# Patient Record
Sex: Female | Born: 1940 | Race: Asian | Hispanic: No | Smoking: Never smoker
Health system: Southern US, Community
[De-identification: ages and names within clinical notes are randomized; demographics above are authoritative.]

## PROBLEM LIST (undated history)

## (undated) DIAGNOSIS — M549 Dorsalgia, unspecified: Secondary | ICD-10-CM

## (undated) DIAGNOSIS — I10 Essential (primary) hypertension: Secondary | ICD-10-CM

## (undated) DIAGNOSIS — E119 Type 2 diabetes mellitus without complications: Secondary | ICD-10-CM

## (undated) HISTORY — PX: NO PAST SURGERIES: SHX2092

---

## 2003-05-23 ENCOUNTER — Emergency Department (HOSPITAL_COMMUNITY): Admission: EM | Admit: 2003-05-23 | Discharge: 2003-05-23 | Payer: Self-pay | Admitting: Emergency Medicine

## 2008-03-21 ENCOUNTER — Emergency Department (HOSPITAL_COMMUNITY): Admission: EM | Admit: 2008-03-21 | Discharge: 2008-03-21 | Payer: Self-pay | Admitting: Emergency Medicine

## 2013-08-13 ENCOUNTER — Encounter (HOSPITAL_COMMUNITY): Payer: Self-pay | Admitting: Cardiology

## 2013-08-13 ENCOUNTER — Emergency Department (HOSPITAL_COMMUNITY)
Admission: EM | Admit: 2013-08-13 | Discharge: 2013-08-13 | Disposition: A | Discharge status: Home / Self Care | Payer: Medicare Other | Attending: Emergency Medicine | Admitting: Emergency Medicine

## 2013-08-13 ENCOUNTER — Emergency Department (HOSPITAL_COMMUNITY): Payer: Medicare Other

## 2013-08-13 DIAGNOSIS — R0600 Dyspnea, unspecified: Secondary | ICD-10-CM

## 2013-08-13 DIAGNOSIS — M549 Dorsalgia, unspecified: Secondary | ICD-10-CM | POA: Insufficient documentation

## 2013-08-13 DIAGNOSIS — R5383 Other fatigue: Secondary | ICD-10-CM

## 2013-08-13 DIAGNOSIS — Z791 Long term (current) use of non-steroidal anti-inflammatories (NSAID): Secondary | ICD-10-CM | POA: Insufficient documentation

## 2013-08-13 DIAGNOSIS — R0609 Other forms of dyspnea: Secondary | ICD-10-CM | POA: Insufficient documentation

## 2013-08-13 DIAGNOSIS — R5381 Other malaise: Secondary | ICD-10-CM | POA: Insufficient documentation

## 2013-08-13 DIAGNOSIS — I10 Essential (primary) hypertension: Secondary | ICD-10-CM | POA: Insufficient documentation

## 2013-08-13 DIAGNOSIS — Z79899 Other long term (current) drug therapy: Secondary | ICD-10-CM | POA: Insufficient documentation

## 2013-08-13 DIAGNOSIS — R0989 Other specified symptoms and signs involving the circulatory and respiratory systems: Secondary | ICD-10-CM | POA: Insufficient documentation

## 2013-08-13 HISTORY — DX: Essential (primary) hypertension: I10

## 2013-08-13 HISTORY — DX: Dorsalgia, unspecified: M54.9

## 2013-08-13 LAB — URINALYSIS, ROUTINE W REFLEX MICROSCOPIC
Glucose, UA: NEGATIVE mg/dL
Leukocytes, UA: NEGATIVE
Specific Gravity, Urine: 1.009 (ref 1.005–1.030)
pH: 6 (ref 5.0–8.0)

## 2013-08-13 LAB — CBC
MCH: 29.7 pg (ref 26.0–34.0)
MCV: 88 fL (ref 78.0–100.0)
Platelets: 223 10*3/uL (ref 150–400)
RBC: 4.74 MIL/uL (ref 3.87–5.11)

## 2013-08-13 LAB — BASIC METABOLIC PANEL
CO2: 27 mEq/L (ref 19–32)
Calcium: 9.7 mg/dL (ref 8.4–10.5)
Creatinine, Ser: 0.9 mg/dL (ref 0.50–1.10)
Glucose, Bld: 115 mg/dL — ABNORMAL HIGH (ref 70–99)

## 2013-08-13 LAB — PRO B NATRIURETIC PEPTIDE: Pro B Natriuretic peptide (BNP): 7.1 pg/mL (ref 0–125)

## 2013-08-13 NOTE — ED Provider Notes (Signed)
CSN: [id number]     Arrival date & time 08/13/13  1622 History   First MD Initiated Contact with Patient 08/13/13 1943     Chief Complaint  Patient presents with  . Shortness of Breath   (Consider location/radiation/quality/duration/timing/severity/associated sxs/prior Treatment) HPI Comments: 72 yo female with htn, borderline dm presents with gradually worsening sob the past three days.  Pt feels both mild sob and general fatigue.  Patient denies blood clot history, active cancer, recent major trauma or surgery, unilateral leg swelling/ pain.  No CP. Non smoker.    Patient is a 72 y.o. female presenting with shortness of breath. The history is provided by the patient and a relative. The history is limited by a language barrier.  Shortness of Breath Severity:  Moderate Associated symptoms: no abdominal pain, no chest pain, no cough, no fever, no headaches, no rash and no vomiting     Past Medical History  Diagnosis Date  . Hypertension   . Back pain    History reviewed. No pertinent past surgical history. History reviewed. No pertinent family history. History  Substance Use Topics  . Smoking status: Never Smoker   . Smokeless tobacco: Not on file  . Alcohol Use: No   OB History   Grav Para Term Preterm Abortions TAB SAB Ect Mult Living                 Review of Systems  Constitutional: Positive for fatigue. Negative for fever and chills.  Respiratory: Positive for shortness of breath. Negative for cough.   Cardiovascular: Negative for chest pain and leg swelling.  Gastrointestinal: Negative for vomiting and abdominal pain.  Genitourinary: Negative for dysuria.  Musculoskeletal: Negative for back pain.  Skin: Negative for rash.  Neurological: Negative for light-headedness and headaches.    Allergies  Review of patient's allergies indicates no known allergies.  Home Medications   Current Outpatient Rx  Name  Route  Sig  Dispense  Refill  . hydrochlorothiazide  (HYDRODIURIL) 25 MG tablet   Oral   Take 25 mg by mouth daily.         Marland Kitchen HYDROcodone-acetaminophen (NORCO/VICODIN) 5-325 MG per tablet   Oral   Take 1 tablet by mouth every 6 (six) hours as needed for pain.         Marland Kitchen ipratropium (ATROVENT HFA) 17 MCG/ACT inhaler   Inhalation   Inhale 2 puffs into the lungs 4 (four) times daily.         Marland Kitchen lisinopril (PRINIVIL,ZESTRIL) 5 MG tablet   Oral   Take 5 mg by mouth daily.         . meloxicam (MOBIC) 15 MG tablet   Oral   Take 15 mg by mouth daily.          BP 138/69  Pulse 74  Temp(Src) 98.5 F (36.9 C) (Oral)  Resp 20  SpO2 99% Physical Exam  Nursing note and vitals reviewed. Constitutional: She is oriented to person, place, and time. She appears well-developed and well-nourished.  HENT:  Head: Normocephalic and atraumatic.  Eyes: Conjunctivae are normal. Right eye exhibits no discharge. Left eye exhibits no discharge.  Neck: Normal range of motion. Neck supple. No tracheal deviation present.  Cardiovascular: Normal rate and regular rhythm.   Pulmonary/Chest: Effort normal and breath sounds normal.  Abdominal: Soft. She exhibits no distension. There is no tenderness. There is no guarding.  Musculoskeletal: She exhibits no edema and no tenderness.  Neurological: She is alert and oriented to  person, place, and time. No cranial nerve deficit.  Skin: Skin is warm. No rash noted.  Psychiatric: She has a normal mood and affect.    ED Course  Procedures (including critical care time) Labs Review Labs Reviewed  CBC - Abnormal; Notable for the following:    WBC 10.8 (*)    All other components within normal limits  BASIC METABOLIC PANEL - Abnormal; Notable for the following:    Sodium 133 (*)    Glucose, Bld 115 (*)    BUN 27 (*)    GFR calc non Af Amer 63 (*)    GFR calc Af Amer 73 (*)    All other components within normal limits  PRO B NATRIURETIC PEPTIDE  URINALYSIS, ROUTINE W REFLEX MICROSCOPIC  TROPONIN I   POCT I-STAT TROPONIN I   Imaging Review Dg Chest 2 View  08/13/2013   *RADIOLOGY REPORT*  Clinical Data: Shortness of breath  CHEST - 2 VIEW  Comparison: None.  Findings: There is no edema or consolidation.  Heart size and pulmonary vascularity are normal.  No adenopathy.  There is atherosclerotic change in the aorta.  There is mild upper thoracic dextroscoliosis.  IMPRESSION: No edema or consolidation.   Original Report Authenticated By: Bretta Bang, M.D.    MDM  No diagnosis found. Family translated, comfortable with plan.  Vague sxs.  Pt denies depression sxs.  Cardiac/ lab workup no acute findings. Close fup discussed.   CXR and ekg reviewed.  Date: 08/13/2013  Rate: 78  Rhythm: normal sinus rhythm  QRS Axis: normal  Intervals: normal  ST/T Wave abnormalities: normal  Conduction Disutrbances:none  Narrative Interpretation:   Old EKG Reviewed: none available  DC after delta troponin neg.     Enid Skeens, MD 08/13/13 2130

## 2013-08-13 NOTE — ED Notes (Signed)
Pt and family reports that increased SOB and fatigue over the past couple of days. States that she also has a hx of lower back pain. Denies any chest pain at this time. Skin warm and dry.

## 2013-09-23 ENCOUNTER — Other Ambulatory Visit: Payer: Self-pay | Admitting: Orthopedic Surgery

## 2013-09-30 ENCOUNTER — Other Ambulatory Visit: Payer: Self-pay | Admitting: Orthopedic Surgery

## 2013-09-30 NOTE — H&P (Signed)
Shannon Stephenson is an 72 y.o. female.   Chief Complaint: back and right leg pain HPI: The patient is a 72 year old female who presents with back pain. The patient reports low back symptoms including low back pain and right lower extremity pain which began 2 month(s) ago in association with an established activity. The injury occurred due to lifting (her grandchild). The pain radiates to the right posterior thigh, right posterior lower leg and right foot. The patient describes the severity of their symptoms as severe. Symptoms are exacerbated by standing, lifting and bending, while symptoms are not exacerbated by recumbency. Prior to being seen today the patient was previously evaluated by Dr. Ramos and Dr. Matthews. Past evaluation has included x-ray of the lumbar spine and MRI of the lumbar spine. Past treatment has included non-opioid analgesics (Tramadol and Tylenol) and physical therapy. Severe pain down the right leg and also on the left. She is here on kind referral from Dr. Ramos. They talked about epidurals. She has declined. She is here with her daughter. Past Medical History  Diagnosis Date  . Hypertension   . Back pain     No past surgical history on file.  No family history on file. Social History:  reports that she has never smoked. She does not have any smokeless tobacco history on file. She reports that she does not drink alcohol or use illicit drugs.  Allergies: No Known Allergies   (Not in a hospital admission)  No results found for this or any previous visit (from the past 48 hour(s)). No results found.  Review of Systems  Constitutional: Negative.   HENT: Negative.   Eyes: Negative.   Respiratory: Negative.   Cardiovascular: Negative.   Gastrointestinal: Negative.   Genitourinary: Negative.   Musculoskeletal: Positive for back pain.  Skin: Negative.   Neurological: Positive for focal weakness.  Endo/Heme/Allergies: Negative.   Psychiatric/Behavioral:  Negative.     There were no vitals taken for this visit. Physical Exam  Constitutional: She is oriented to person, place, and time. She appears well-developed and well-nourished.  HENT:  Head: Normocephalic and atraumatic.  Eyes: Conjunctivae and EOM are normal. Pupils are equal, round, and reactive to light.  Neck: Normal range of motion. Neck supple.  Cardiovascular: Normal rate.   Respiratory: Effort normal and breath sounds normal.  GI: Soft. Bowel sounds are normal.  Musculoskeletal:  On exam, in moderate distress. Walks with antalgic gait and forward flexion. Mood and affect is appropriate. Straight leg raise on the right produces buttock, thigh, and calf pain with buttock pain on the left. She has quad weakness and EHL weakness on the right compared to the left. Pain with extension and relieved with flexion. Pulses are intact. No DVT. Inspection of the cervical spine reveals a normal lordosis without evidence of paraspinous spasms or soft tissue swelling. Nontender to palpation. Full flexion, full extension, full left and right lateral rotation. Extension combined with lateral flexion does not reproduce pain. Negative impingement sign, negative secondary impingement sign of the shoulders. Negative Tinel's at median and ulnar nerves at the elbow. Negative carpal compression test at the wrists. Motor of the upper extremities is 5/5 including biceps, triceps, brachioradialis, wrist flexion, wrist extension, finger flexion, finger extension. Reflexes are normoreflexia. Sensory exam is intact to light touch. There is no Hoffmann sign. Nontender over the thoracic spine.  She is hyporeflexic. Good pulses. Compartments are soft.  Neurological: She is alert and oriented to person, place, and time. She has   normal reflexes.  Skin: Skin is warm and dry.  Psychiatric: She has a normal mood and affect.    Three view x-rays show multilevel disc degeneration. Neuroforaminal  narrowing.  MRI demonstrates moderately severe stenosis at 3-4 and at 4-5. Severe in the lateral recess at 4-5 centrally at 3-4 and some foraminal stenosis.  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 Refractory to rest, activity modification. She has done home stretches and exercises. Neural tension signs. Quad weakness. EHL weakness. Severe pain. Multilevel disc degeneration.  I had an extensive discussion of the risks and benefits of the lumbar decompression with the patient including bleeding, infection, damage to neurovascular structures, epidural fibrosis, CSF leak requiring repair. We also discussed increase in pain, adjacent segment disease, recurrent disc herniation, need for future surgery including repeat decompression and/or fusion. We also discussed risks of postoperative hematoma, paralysis, anesthetic complications including DVT, PE, death, cardiopulmonary dysfunction. In addition, the perioperative and postoperative courses were discussed in detail including the rehabilitative time and return to functional activity and work. I provided the patient with an illustrated handout and utilized the appropriate surgical models.  I had an extensive discussion concerning current pathology, relevant anatomy, and treatment options. She is reporting being disabled by her symptoms. She has to sit. She cannot stand. She uses an assistive device. They do not want to proceed with epidurals and given the recent studies shows no changes in the long term outcome. I think that is reasonable. We will have her undergo preop clearance and discussed decompression at 4-5 and at 3-4. Overnight. She does not speak English. They are from [place]. Her daughter will be present. No history of DVT. She is significantly symptomatic and she reports multiyear history of this and she can not even walk to the mail box at this point in time. I feel  her main problem could be at 4-5. She is having knee pain as well. I will have her undergo preop clearance. I appreciate the kind referral by Dr. Ramos.  Plan microlumbar decompression L3-4, L4-5  Rani Idler M. for Dr. Beane 09/30/2013, 12:01 PM    

## 2013-10-03 NOTE — Patient Instructions (Addendum)
20      Your procedure is scheduled on:  Wednesday 10/09/2013  Report to [facility] at  05:30 AM.  Call this number if you have problems the night before or morning of surgery: [phone]   Remember:  Do not eat food or drink liquids AFTER MIDNIGHT!   Do not bring valuables to the hospital. [facility] IS NOT RESPONSIBLE  FOR ANY BELONGINGS OR VALUABLES BROUGHT TO HOSPITAL.  Leave suitcase in the car. After surgery it may be brought to your room.  For patients admitted to the hospital, checkout time is 11:00 AM the day of              Discharge.    DO NOT WEAR JEWELRY , MAKE-UP, LOTIONS,POWDERS,PERFUMES!             WOMEN -DO NOT SHAVE LEGS OR UNDERARMS 12 HRS. BEFORE SURGERY!               MEN MAY SHAVE AS USUAL!             CONTACTS,DENTURES OR BRIDGEWORK, FALSE EYELASHES MAY NOT BE WORN INTO SURGERY!                                        Special Instructions:             Please read over the following fact sheets that you were given: INCENTIVE SPIROMETRY                                        Hope Pigeon     [phone]                FAILURE TO FOLLOW THESE INSTRUCTIONS MAY RESULT IN  CANCELLATION OF YOUR SURGERY!               Patient Signature:___________________________

## 2013-10-04 ENCOUNTER — Encounter (HOSPITAL_COMMUNITY): Payer: Self-pay | Admitting: Pharmacy Technician

## 2013-10-04 ENCOUNTER — Encounter (HOSPITAL_COMMUNITY): Payer: Self-pay

## 2013-10-04 ENCOUNTER — Ambulatory Visit (HOSPITAL_COMMUNITY)
Admission: RE | Admit: 2013-10-04 | Discharge: 2013-10-04 | Disposition: A | Discharge status: Home / Self Care | Payer: Medicare Other | Source: Ambulatory Visit | Attending: Orthopedic Surgery | Admitting: Orthopedic Surgery

## 2013-10-04 ENCOUNTER — Encounter (HOSPITAL_COMMUNITY)
Admission: RE | Admit: 2013-10-04 | Discharge: 2013-10-04 | Disposition: A | Discharge status: Home / Self Care | Payer: Medicare Other | Source: Ambulatory Visit | Attending: Specialist | Admitting: Specialist

## 2013-10-04 DIAGNOSIS — Z01812 Encounter for preprocedural laboratory examination: Secondary | ICD-10-CM | POA: Insufficient documentation

## 2013-10-04 DIAGNOSIS — M5137 Other intervertebral disc degeneration, lumbosacral region: Secondary | ICD-10-CM | POA: Insufficient documentation

## 2013-10-04 DIAGNOSIS — Z01818 Encounter for other preprocedural examination: Secondary | ICD-10-CM | POA: Insufficient documentation

## 2013-10-04 DIAGNOSIS — M51379 Other intervertebral disc degeneration, lumbosacral region without mention of lumbar back pain or lower extremity pain: Secondary | ICD-10-CM | POA: Insufficient documentation

## 2013-10-04 LAB — SURGICAL PCR SCREEN
MRSA, PCR: NEGATIVE
Staphylococcus aureus: NEGATIVE

## 2013-10-04 LAB — CBC
Hemoglobin: 13.5 g/dL (ref 12.0–15.0)
MCH: 28.9 pg (ref 26.0–34.0)
MCHC: 32.4 g/dL (ref 30.0–36.0)
Platelets: 279 10*3/uL (ref 150–400)
RDW: 12.9 % (ref 11.5–15.5)
WBC: 10.8 10*3/uL — ABNORMAL HIGH (ref 4.0–10.5)

## 2013-10-04 LAB — BASIC METABOLIC PANEL
BUN: 31 mg/dL — ABNORMAL HIGH (ref 6–23)
Calcium: 10.1 mg/dL (ref 8.4–10.5)
Chloride: 97 mEq/L (ref 96–112)
Creatinine, Ser: 0.9 mg/dL (ref 0.50–1.10)
GFR calc Af Amer: 73 mL/min — ABNORMAL LOW (ref >90)
GFR calc non Af Amer: 63 mL/min — ABNORMAL LOW (ref >90)

## 2013-10-04 NOTE — Progress Notes (Signed)
Chest x-ray 08/13/13 on EPIC, EKG 08/13/13 on EPIC

## 2013-10-08 NOTE — Anesthesia Preprocedure Evaluation (Addendum)
Anesthesia Evaluation  Patient identified by MRN, date of birth, ID band Patient awake    Reviewed: Allergy & Precautions, H&P , NPO status , Patient's Chart, lab work & pertinent test results  Airway Mallampati: II TM Distance: >3 FB Neck ROM: full    Dental no notable dental hx. (+) Teeth Intact and Dental Advisory Given   Pulmonary neg pulmonary ROS,  breath sounds clear to auscultation  Pulmonary exam normal       Cardiovascular hypertension, Pt. on medications Rhythm:regular Rate:Normal     Neuro/Psych negative neurological ROS  negative psych ROS   GI/Hepatic negative GI ROS, Neg liver ROS,   Endo/Other  negative endocrine ROS  Renal/GU negative Renal ROS  negative genitourinary   Musculoskeletal   Abdominal   Peds  Hematology negative hematology ROS (+)   Anesthesia Other Findings   Reproductive/Obstetrics negative OB ROS                          Anesthesia Physical Anesthesia Plan  ASA: II  Anesthesia Plan: General   Post-op Pain Management:    Induction: Intravenous  Airway Management Planned: Oral ETT  Additional Equipment:   Intra-op Plan:   Post-operative Plan: Extubation in OR  Informed Consent: I have reviewed the patients History and Physical, chart, labs and discussed the procedure including the risks, benefits and alternatives for the proposed anesthesia with the patient or authorized representative who has indicated his/her understanding and acceptance.   Dental Advisory Given  Plan Discussed with: CRNA and Surgeon  Anesthesia Plan Comments:         Anesthesia Quick Evaluation

## 2013-10-09 ENCOUNTER — Encounter (HOSPITAL_COMMUNITY): Payer: Medicare Other | Admitting: Anesthesiology

## 2013-10-09 ENCOUNTER — Encounter (HOSPITAL_COMMUNITY)
Admission: RE | Disposition: A | Discharge status: Home / Self Care | Payer: Self-pay | Source: Ambulatory Visit | Attending: Specialist

## 2013-10-09 ENCOUNTER — Ambulatory Visit (HOSPITAL_COMMUNITY): Payer: Medicare Other

## 2013-10-09 ENCOUNTER — Ambulatory Visit (HOSPITAL_COMMUNITY): Payer: Medicare Other | Admitting: Anesthesiology

## 2013-10-09 ENCOUNTER — Observation Stay (HOSPITAL_COMMUNITY)
Admission: RE | Admit: 2013-10-09 | Discharge: 2013-10-10 | Disposition: A | Discharge status: Home / Self Care | Payer: Medicare Other | Source: Ambulatory Visit | Attending: Specialist | Admitting: Specialist

## 2013-10-09 ENCOUNTER — Encounter (HOSPITAL_COMMUNITY): Payer: Self-pay | Admitting: *Deleted

## 2013-10-09 DIAGNOSIS — M5137 Other intervertebral disc degeneration, lumbosacral region: Secondary | ICD-10-CM | POA: Insufficient documentation

## 2013-10-09 DIAGNOSIS — I10 Essential (primary) hypertension: Secondary | ICD-10-CM | POA: Insufficient documentation

## 2013-10-09 DIAGNOSIS — M48061 Spinal stenosis, lumbar region without neurogenic claudication: Principal | ICD-10-CM

## 2013-10-09 DIAGNOSIS — R29898 Other symptoms and signs involving the musculoskeletal system: Secondary | ICD-10-CM | POA: Insufficient documentation

## 2013-10-09 DIAGNOSIS — M51379 Other intervertebral disc degeneration, lumbosacral region without mention of lumbar back pain or lower extremity pain: Secondary | ICD-10-CM | POA: Insufficient documentation

## 2013-10-09 DIAGNOSIS — M79609 Pain in unspecified limb: Secondary | ICD-10-CM | POA: Insufficient documentation

## 2013-10-09 HISTORY — PX: LUMBAR LAMINECTOMY/DECOMPRESSION MICRODISCECTOMY: SHX5026

## 2013-10-09 LAB — PROTIME-INR: INR: 1.03 (ref 0.00–1.49)

## 2013-10-09 SURGERY — LUMBAR LAMINECTOMY/DECOMPRESSION MICRODISCECTOMY 2 LEVELS
Anesthesia: General | Site: Back | Laterality: Bilateral | Wound class: Clean

## 2013-10-09 MED ORDER — LACTATED RINGERS IV SOLN
INTRAVENOUS | Status: DC
  Administered 2013-10-09 (×3): via INTRAVENOUS

## 2013-10-09 MED ORDER — ONDANSETRON HCL 4 MG/2ML IJ SOLN
INTRAMUSCULAR | Status: DC | PRN
  Administered 2013-10-09: 4 mg via INTRAVENOUS

## 2013-10-09 MED ORDER — KCL IN DEXTROSE-NACL 20-5-0.9 MEQ/L-%-% IV SOLN
INTRAVENOUS | Status: AC
  Administered 2013-10-09: 50 mL/h via INTRAVENOUS
  Filled 2013-10-09 (×2): qty 1000

## 2013-10-09 MED ORDER — DOCUSATE SODIUM 100 MG PO CAPS
100.0000 mg | ORAL_CAPSULE | Freq: Two times a day (BID) | ORAL | Status: DC
  Administered 2013-10-09 – 2013-10-10 (×2): 100 mg via ORAL

## 2013-10-09 MED ORDER — LISINOPRIL 5 MG PO TABS: 5.0000 mg | ORAL_TABLET | Freq: Every morning | ORAL | Status: DC

## 2013-10-09 MED ORDER — BUPIVACAINE-EPINEPHRINE (PF) 0.5% -1:200000 IJ SOLN
INTRAMUSCULAR | Status: DC | PRN
  Administered 2013-10-09: 17 mL

## 2013-10-09 MED ORDER — PHENYLEPHRINE HCL 10 MG/ML IJ SOLN
INTRAMUSCULAR | Status: DC | PRN
  Administered 2013-10-09 (×3): 80 ug via INTRAVENOUS
  Administered 2013-10-09 (×2): 120 ug via INTRAVENOUS

## 2013-10-09 MED ORDER — GLYCOPYRROLATE 0.2 MG/ML IJ SOLN
INTRAMUSCULAR | Status: DC | PRN
  Administered 2013-10-09: .5 mg via INTRAVENOUS

## 2013-10-09 MED ORDER — NEOSTIGMINE METHYLSULFATE 1 MG/ML IJ SOLN
INTRAMUSCULAR | Status: DC | PRN
  Administered 2013-10-09: 4 mg via INTRAVENOUS

## 2013-10-09 MED ORDER — THROMBIN 5000 UNITS EX SOLR
CUTANEOUS | Status: DC | PRN
  Administered 2013-10-09: 10000 [IU] via TOPICAL

## 2013-10-09 MED ORDER — MENTHOL 3 MG MT LOZG: 1.0000 | LOZENGE | OROMUCOSAL | Status: DC | PRN

## 2013-10-09 MED ORDER — ONDANSETRON HCL 4 MG/2ML IJ SOLN: 4.0000 mg | INTRAMUSCULAR | Status: DC | PRN

## 2013-10-09 MED ORDER — PHENOL 1.4 % MT LIQD: 1.0000 | OROMUCOSAL | Status: DC | PRN

## 2013-10-09 MED ORDER — OXYCODONE-ACETAMINOPHEN 5-325 MG PO TABS
1.0000 | ORAL_TABLET | ORAL | Status: DC | PRN
Start: 2013-10-09 — End: 2013-10-10
  Administered 2013-10-09: 1 via ORAL
  Administered 2013-10-10: 2 via ORAL
  Filled 2013-10-09: qty 2
  Filled 2013-10-09: qty 1

## 2013-10-09 MED ORDER — ACETAMINOPHEN 650 MG RE SUPP: 650.0000 mg | RECTAL | Status: DC | PRN

## 2013-10-09 MED ORDER — ROCURONIUM BROMIDE 100 MG/10ML IV SOLN
INTRAVENOUS | Status: DC | PRN
  Administered 2013-10-09: 20 mg via INTRAVENOUS

## 2013-10-09 MED ORDER — HYDROCHLOROTHIAZIDE 25 MG PO TABS
25.0000 mg | ORAL_TABLET | Freq: Every morning | ORAL | Status: DC
  Administered 2013-10-10: 08:00:00 25 mg via ORAL
  Filled 2013-10-09 (×2): qty 1

## 2013-10-09 MED ORDER — LACTATED RINGERS IV SOLN: INTRAVENOUS | Status: DC

## 2013-10-09 MED ORDER — CEFAZOLIN SODIUM-DEXTROSE 2-3 GM-% IV SOLR
INTRAVENOUS | Status: AC
  Filled 2013-10-09: qty 50

## 2013-10-09 MED ORDER — DEXAMETHASONE SODIUM PHOSPHATE 10 MG/ML IJ SOLN
INTRAMUSCULAR | Status: DC | PRN
  Administered 2013-10-09: 10 mg via INTRAVENOUS

## 2013-10-09 MED ORDER — SUCCINYLCHOLINE CHLORIDE 20 MG/ML IJ SOLN
INTRAMUSCULAR | Status: DC | PRN
  Administered 2013-10-09: 100 mg via INTRAVENOUS

## 2013-10-09 MED ORDER — HYDROMORPHONE HCL PF 1 MG/ML IJ SOLN
INTRAMUSCULAR | Status: AC
  Filled 2013-10-09: qty 1

## 2013-10-09 MED ORDER — HYDROMORPHONE HCL PF 1 MG/ML IJ SOLN
0.5000 mg | INTRAMUSCULAR | Status: DC | PRN
Start: 2013-10-09 — End: 2013-10-10
  Administered 2013-10-09: 13:00:00 0.5 mg via INTRAVENOUS

## 2013-10-09 MED ORDER — CEFAZOLIN SODIUM-DEXTROSE 2-3 GM-% IV SOLR
2.0000 g | Freq: Three times a day (TID) | INTRAVENOUS | Status: AC
  Administered 2013-10-09 (×2): 2 g via INTRAVENOUS
  Filled 2013-10-09 (×3): qty 50

## 2013-10-09 MED ORDER — THROMBIN 5000 UNITS EX SOLR
CUTANEOUS | Status: AC
  Filled 2013-10-09: qty 10000

## 2013-10-09 MED ORDER — BUPIVACAINE-EPINEPHRINE PF 0.5-1:200000 % IJ SOLN
INTRAMUSCULAR | Status: AC
  Filled 2013-10-09: qty 30

## 2013-10-09 MED ORDER — FENTANYL CITRATE 0.05 MG/ML IJ SOLN
INTRAMUSCULAR | Status: DC | PRN
  Administered 2013-10-09: 50 ug via INTRAVENOUS
  Administered 2013-10-09: 100 ug via INTRAVENOUS

## 2013-10-09 MED ORDER — SODIUM CHLORIDE 0.9 % IV SOLN: 250.0000 mL | INTRAVENOUS | Status: DC

## 2013-10-09 MED ORDER — TRAMADOL HCL 50 MG PO TABS: 50.0000 mg | ORAL_TABLET | Freq: Three times a day (TID) | ORAL | Status: DC | PRN

## 2013-10-09 MED ORDER — SODIUM CHLORIDE 0.9 % IJ SOLN: 3.0000 mL | Freq: Two times a day (BID) | INTRAMUSCULAR | Status: DC

## 2013-10-09 MED ORDER — CEFAZOLIN SODIUM-DEXTROSE 2-3 GM-% IV SOLR
2.0000 g | INTRAVENOUS | Status: AC
  Administered 2013-10-09: 2 g via INTRAVENOUS

## 2013-10-09 MED ORDER — HYDROMORPHONE HCL PF 1 MG/ML IJ SOLN: 0.2500 mg | INTRAMUSCULAR | Status: DC | PRN

## 2013-10-09 MED ORDER — PROPOFOL 10 MG/ML IV BOLUS
INTRAVENOUS | Status: DC | PRN
  Administered 2013-10-09: 150 mg via INTRAVENOUS

## 2013-10-09 MED ORDER — SODIUM CHLORIDE 0.9 % IR SOLN
Status: DC | PRN
  Administered 2013-10-09: 09:00:00

## 2013-10-09 MED ORDER — ACETAMINOPHEN 325 MG PO TABS: 650.0000 mg | ORAL_TABLET | ORAL | Status: DC | PRN

## 2013-10-09 MED ORDER — TRAMADOL HCL 50 MG PO TABS: 50.0000 mg | ORAL_TABLET | Freq: Four times a day (QID) | ORAL | Status: AC | PRN

## 2013-10-09 MED ORDER — SODIUM CHLORIDE 0.9 % IJ SOLN: 3.0000 mL | INTRAMUSCULAR | Status: DC | PRN

## 2013-10-09 MED ORDER — LISINOPRIL 5 MG PO TABS
5.0000 mg | ORAL_TABLET | Freq: Every morning | ORAL | Status: DC
  Administered 2013-10-10: 5 mg via ORAL
  Filled 2013-10-09 (×2): qty 1

## 2013-10-09 MED ORDER — HYDROCODONE-ACETAMINOPHEN 5-325 MG PO TABS: 1.0000 | ORAL_TABLET | ORAL | Status: DC | PRN

## 2013-10-09 MED ORDER — LIP MEDEX EX OINT
TOPICAL_OINTMENT | CUTANEOUS | Status: AC
  Filled 2013-10-09: qty 7

## 2013-10-09 SURGICAL SUPPLY — 48 items
BAG ZIPLOCK 12X15 (MISCELLANEOUS) ×2 IMPLANT
BENZOIN TINCTURE PRP APPL 2/3 (GAUZE/BANDAGES/DRESSINGS) ×2 IMPLANT
CHLORAPREP W/TINT 26ML (MISCELLANEOUS) IMPLANT
CLEANER TIP ELECTROSURG 2X2 (MISCELLANEOUS) ×2 IMPLANT
CLOTH 2% CHLOROHEXIDINE 3PK (PERSONAL CARE ITEMS) ×2 IMPLANT
DECANTER SPIKE VIAL GLASS SM (MISCELLANEOUS) ×2 IMPLANT
DRAPE MICROSCOPE LEICA (MISCELLANEOUS) ×2 IMPLANT
DRAPE POUCH INSTRU U-SHP 10X18 (DRAPES) ×2 IMPLANT
DRAPE SURG 17X11 SM STRL (DRAPES) ×2 IMPLANT
DRAPE UTILITY XL STRL (DRAPES) ×2 IMPLANT
DRSG AQUACEL AG ADV 3.5X 4 (GAUZE/BANDAGES/DRESSINGS) IMPLANT
DRSG AQUACEL AG ADV 3.5X 6 (GAUZE/BANDAGES/DRESSINGS) IMPLANT
DURAPREP 26ML APPLICATOR (WOUND CARE) ×2 IMPLANT
DURASEAL SPINE SEALANT 3ML (MISCELLANEOUS) IMPLANT
ELECT BLADE TIP CTD 4 INCH (ELECTRODE) ×2 IMPLANT
ELECT REM PT RETURN 9FT ADLT (ELECTROSURGICAL) ×2
ELECTRODE REM PT RTRN 9FT ADLT (ELECTROSURGICAL) ×1 IMPLANT
FLOSEAL 10ML (HEMOSTASIS) ×2 IMPLANT
GLOVE BIOGEL PI IND STRL 7.5 (GLOVE) ×1 IMPLANT
GLOVE BIOGEL PI INDICATOR 7.5 (GLOVE) ×1
GLOVE SURG SS PI 7.5 STRL IVOR (GLOVE) ×2 IMPLANT
GLOVE SURG SS PI 8.0 STRL IVOR (GLOVE) ×4 IMPLANT
GOWN STRL REIN XL XLG (GOWN DISPOSABLE) ×4 IMPLANT
IV CATH 14GX2 1/4 (CATHETERS) ×2 IMPLANT
KIT BASIN OR (CUSTOM PROCEDURE TRAY) ×2 IMPLANT
KIT POSITIONING SURG ANDREWS (MISCELLANEOUS) ×2 IMPLANT
MANIFOLD NEPTUNE II (INSTRUMENTS) ×2 IMPLANT
NEEDLE SPNL 18GX3.5 QUINCKE PK (NEEDLE) ×6 IMPLANT
PATTIES SURGICAL .5 X.5 (GAUZE/BANDAGES/DRESSINGS) IMPLANT
PATTIES SURGICAL .75X.75 (GAUZE/BANDAGES/DRESSINGS) IMPLANT
PATTIES SURGICAL 1X1 (DISPOSABLE) IMPLANT
SPONGE GAUZE 4X4 12PLY (GAUZE/BANDAGES/DRESSINGS) ×2 IMPLANT
SPONGE SURGIFOAM ABS GEL 100 (HEMOSTASIS) ×2 IMPLANT
STRIP CLOSURE SKIN 1/2X4 (GAUZE/BANDAGES/DRESSINGS) ×2 IMPLANT
SUT NURALON 4 0 TR CR/8 (SUTURE) IMPLANT
SUT PROLENE 3 0 PS 2 (SUTURE) ×2 IMPLANT
SUT VIC AB 1 CT1 27 (SUTURE)
SUT VIC AB 1 CT1 27XBRD ANTBC (SUTURE) IMPLANT
SUT VIC AB 1-0 CT2 27 (SUTURE) ×4 IMPLANT
SUT VIC AB 2-0 CT1 27 (SUTURE)
SUT VIC AB 2-0 CT1 TAPERPNT 27 (SUTURE) IMPLANT
SUT VIC AB 2-0 CT2 27 (SUTURE) IMPLANT
SYRINGE 10CC LL (SYRINGE) ×2 IMPLANT
TAPE CLOTH SURG 6X10 WHT LF (GAUZE/BANDAGES/DRESSINGS) ×2 IMPLANT
TOWEL OR 17X26 10 PK STRL BLUE (TOWEL DISPOSABLE) ×2 IMPLANT
TOWEL OR NON WOVEN STRL DISP B (DISPOSABLE) ×2 IMPLANT
TRAY LAMINECTOMY (CUSTOM PROCEDURE TRAY) ×2 IMPLANT
YANKAUER SUCT BULB TIP NO VENT (SUCTIONS) ×2 IMPLANT

## 2013-10-09 NOTE — Preoperative (Signed)
Beta Blockers   Reason not to administer Beta Blockers:Not Applicable 

## 2013-10-09 NOTE — H&P (View-Only) (Signed)
Shannon Stephenson is an 72 y.o. female.   Chief Complaint: back and right leg pain HPI: The patient is a 72 year old female who presents with back pain. The patient reports low back symptoms including low back pain and right lower extremity pain which began 2 month(s) ago in association with an established activity. The injury occurred due to lifting (her grandchild). The pain radiates to the right posterior thigh, right posterior lower leg and right foot. The patient describes the severity of their symptoms as severe. Symptoms are exacerbated by standing, lifting and bending, while symptoms are not exacerbated by recumbency. Prior to being seen today the patient was previously evaluated by Dr. Ethelene Hal and Dr. Ashley Royalty. Past evaluation has included x-ray of the lumbar spine and MRI of the lumbar spine. Past treatment has included non-opioid analgesics (Tramadol and Tylenol) and physical therapy. Severe pain down the right leg and also on the left. She is here on kind referral from Dr. Ethelene Hal. They talked about epidurals. She has declined. She is here with her daughter. Past Medical History  Diagnosis Date  . Hypertension   . Back pain     No past surgical history on file.  No family history on file. Social History:  reports that she has never smoked. She does not have any smokeless tobacco history on file. She reports that she does not drink alcohol or use illicit drugs.  Allergies: No Known Allergies   (Not in a hospital admission)  No results found for this or any previous visit (from the past 48 hour(s)). No results found.  Review of Systems  Constitutional: Negative.   HENT: Negative.   Eyes: Negative.   Respiratory: Negative.   Cardiovascular: Negative.   Gastrointestinal: Negative.   Genitourinary: Negative.   Musculoskeletal: Positive for back pain.  Skin: Negative.   Neurological: Positive for focal weakness.  Endo/Heme/Allergies: Negative.   Psychiatric/Behavioral:  Negative.     There were no vitals taken for this visit. Physical Exam  Constitutional: She is oriented to person, place, and time. She appears well-developed and well-nourished.  HENT:  Head: Normocephalic and atraumatic.  Eyes: Conjunctivae and EOM are normal. Pupils are equal, round, and reactive to light.  Neck: Normal range of motion. Neck supple.  Cardiovascular: Normal rate.   Respiratory: Effort normal and breath sounds normal.  GI: Soft. Bowel sounds are normal.  Musculoskeletal:  On exam, in moderate distress. Walks with antalgic gait and forward flexion. Mood and affect is appropriate. Straight leg raise on the right produces buttock, thigh, and calf pain with buttock pain on the left. She has quad weakness and EHL weakness on the right compared to the left. Pain with extension and relieved with flexion. Pulses are intact. No DVT. Inspection of the cervical spine reveals a normal lordosis without evidence of paraspinous spasms or soft tissue swelling. Nontender to palpation. Full flexion, full extension, full left and right lateral rotation. Extension combined with lateral flexion does not reproduce pain. Negative impingement sign, negative secondary impingement sign of the shoulders. Negative Tinel's at median and ulnar nerves at the elbow. Negative carpal compression test at the wrists. Motor of the upper extremities is 5/5 including biceps, triceps, brachioradialis, wrist flexion, wrist extension, finger flexion, finger extension. Reflexes are normoreflexia. Sensory exam is intact to light touch. There is no Hoffmann sign. Nontender over the thoracic spine.  She is hyporeflexic. Good pulses. Compartments are soft.  Neurological: She is alert and oriented to person, place, and time. She has  normal reflexes.  Skin: Skin is warm and dry.  Psychiatric: She has a normal mood and affect.    Three view x-rays show multilevel disc degeneration. Neuroforaminal  narrowing.  MRI demonstrates moderately severe stenosis at 3-4 and at 4-5. Severe in the lateral recess at 4-5 centrally at 3-4 and some foraminal stenosis.  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 Refractory to rest, activity modification. She has done home stretches and exercises. Neural tension signs. Quad weakness. EHL weakness. Severe pain. Multilevel disc degeneration.  I had an extensive discussion of the risks and benefits of the lumbar decompression with the patient including bleeding, infection, damage to neurovascular structures, epidural fibrosis, CSF leak requiring repair. We also discussed increase in pain, adjacent segment disease, recurrent disc herniation, need for future surgery including repeat decompression and/or fusion. We also discussed risks of postoperative hematoma, paralysis, anesthetic complications including DVT, PE, death, cardiopulmonary dysfunction. In addition, the perioperative and postoperative courses were discussed in detail including the rehabilitative time and return to functional activity and work. I provided the patient with an illustrated handout and utilized the appropriate surgical models.  I had an extensive discussion concerning current pathology, relevant anatomy, and treatment options. She is reporting being disabled by her symptoms. She has to sit. She cannot stand. She uses an assistive device. They do not want to proceed with epidurals and given the recent studies shows no changes in the long term outcome. I think that is reasonable. We will have her undergo preop clearance and discussed decompression at 4-5 and at 3-4. Overnight. She does not speak [place]. They are from [place]. Her daughter will be present. No history of DVT. She is significantly symptomatic and she reports multiyear history of this and she can not even walk to the mail box at this point in time. I feel  her main problem could be at 4-5. She is having knee pain as well. I will have her undergo preop clearance. I appreciate the kind referral by Dr. Ethelene Hal.  Plan microlumbar decompression L3-4, L4-5  BISSELL, JACLYN M. for Dr. Shelle Iron 09/30/2013, 12:01 PM

## 2013-10-09 NOTE — Transfer of Care (Signed)
Immediate Anesthesia Transfer of Care Note  Patient: Shannon Stephenson  Procedure(s) Performed: Procedure(s): MICRO LUMBAR DECOMPRESSION L3-L5 (2 LEVELS) (Bilateral)  Patient Location: PACU  Anesthesia Type:General  Level of Consciousness: awake and patient cooperative  Airway & Oxygen Therapy: Patient Spontanous Breathing and Patient connected to face mask oxygen  Post-op Assessment: Report given to PACU RN and Post -op Vital signs reviewed and stable  Post vital signs: Reviewed and stable  Complications: No apparent anesthesia complications

## 2013-10-09 NOTE — Brief Op Note (Signed)
10/09/2013  10:14 AM  PATIENT:  Shannon Stephenson  72 y.o. female  PRE-OPERATIVE DIAGNOSIS:  STENOSIS L3-L5   POST-OPERATIVE DIAGNOSIS:  STENOSIS L3-L5   PROCEDURE:  Procedure(s): MICRO LUMBAR DECOMPRESSION L3-L5 (2 LEVELS) (Bilateral)  SURGEON:  Surgeon(s) and Role:    * Javier Docker, MD - Primary  PHYSICIAN ASSISTANT:   ASSISTANTS: Bissell   ANESTHESIA:   general  EBL:  Total I/O In: 2000 [I.V.:2000] Out: 650 [Urine:250; Blood:400]  BLOOD ADMINISTERED:none  DRAINS: none   LOCAL MEDICATIONS USED:  MARCAINE     SPECIMEN:  No Specimen  DISPOSITION OF SPECIMEN:  N/A  COUNTS:  YES  TOURNIQUET:  * No tourniquets in log *  DICTATION: .Other Dictation: Dictation Number [id number]  PLAN OF CARE: Admit for overnight observation  PATIENT DISPOSITION:  PACU - hemodynamically stable.   Delay start of Pharmacological VTE agent (>24hrs) due to surgical blood loss or risk of bleeding: yes

## 2013-10-09 NOTE — Anesthesia Postprocedure Evaluation (Signed)
  Anesthesia Post-op Note  Patient: Shannon Stephenson  Procedure(s) Performed: Procedure(s) (LRB): MICRO LUMBAR DECOMPRESSION L3-L5 (2 LEVELS) (Bilateral)  Patient Location: PACU  Anesthesia Type: General  Level of Consciousness: awake and alert   Airway and Oxygen Therapy: Patient Spontanous Breathing  Post-op Pain: mild  Post-op Assessment: Post-op Vital signs reviewed, Patient's Cardiovascular Status Stable, Respiratory Function Stable, Patent Airway and No signs of Nausea or vomiting  Last Vitals:  Filed Vitals:   10/09/13 1230  BP: 124/60  Pulse: 77  Temp:   Resp: 15    Post-op Vital Signs: stable   Complications: No apparent anesthesia complications

## 2013-10-09 NOTE — Interval H&P Note (Signed)
History and Physical Interval Note:  10/09/2013 8:05 AM  Shannon Stephenson  has presented today for surgery, with the diagnosis of STENOSIS L3-L5   The various methods of treatment have been discussed with the patient and family. After consideration of risks, benefits and other options for treatment, the patient has consented to  Procedure(s): MICRO LUMBAR DECOMPRESSION L3-L5 (2 LEVELS) (N/A) as a surgical intervention .  The patient's history has been reviewed, patient examined, no change in status, stable for surgery.  I have reviewed the patient's chart and labs.  Questions were answered to the patient's satisfaction.     Kaylah Chiasson C

## 2013-10-09 NOTE — Progress Notes (Signed)
Patient's son Leonides Sake is with the patient to interpret for her this morning. His cel # is [phone]

## 2013-10-10 ENCOUNTER — Encounter (HOSPITAL_COMMUNITY): Payer: Self-pay | Admitting: Specialist

## 2013-10-10 LAB — CBC
HCT: 34.4 % — ABNORMAL LOW (ref 36.0–46.0)
RDW: 12.4 % (ref 11.5–15.5)
WBC: 25.9 10*3/uL — ABNORMAL HIGH (ref 4.0–10.5)

## 2013-10-10 LAB — BASIC METABOLIC PANEL
BUN: 25 mg/dL — ABNORMAL HIGH (ref 6–23)
CO2: 25 mEq/L (ref 19–32)
Chloride: 100 mEq/L (ref 96–112)
GFR calc Af Amer: 74 mL/min — ABNORMAL LOW (ref >90)
Potassium: 5.3 mEq/L — ABNORMAL HIGH (ref 3.5–5.1)

## 2013-10-10 LAB — PLATELET FUNCTION ASSAY: Collagen / Epinephrine: 102 seconds (ref 0–184)

## 2013-10-10 NOTE — Progress Notes (Signed)
Pt to d/c home. DME provided. No difficulty voiding. AVS reviewed and "My Chart" discussed with pt. Pt capable of verbalizing medications and follow-up appointments. Remains hemodynamically stable. No signs and symptoms of distress. Educated pt to return to ER in the case of SOB, dizziness, or chest pain.

## 2013-10-10 NOTE — Discharge Summary (Signed)
Physician Discharge Summary   Patient ID: Shannon Stephenson MRN: [id number] DOB/AGE: March 03, 1941 72 y.o.  Admit date: 10/09/2013 Discharge date: 10/10/2013  Primary Diagnosis:   STENOSIS L3-L5   Admission Diagnoses:  Past Medical History  Diagnosis Date  . Hypertension   . Back pain    Discharge Diagnoses:   Principal Problem:   Spinal stenosis of lumbar region  Procedure:  Procedure(s) (LRB): MICRO LUMBAR DECOMPRESSION L3-L5 (2 LEVELS) (Bilateral)   Consults: None  HPI:  see H&P    Laboratory Data: Hospital Outpatient Visit on 10/04/2013  Component Date Value Range Status  . MRSA, PCR 10/04/2013 NEGATIVE  NEGATIVE Final  . Staphylococcus aureus 10/04/2013 NEGATIVE  NEGATIVE Final   Comment:                                 The Xpert SA Assay (FDA                          approved for NASAL specimens                          in patients over 6 years of age),                          is one component of                          a comprehensive surveillance                          program.  Test performance has                          been validated by [employer] for patients greater                          than or equal to 65 year old.                          It is not intended                          to diagnose infection nor to                          guide or monitor treatment.  . Sodium 10/04/2013 136  135 - 145 mEq/L Final  . Potassium 10/04/2013 4.1  3.5 - 5.1 mEq/L Final  . Chloride 10/04/2013 97  96 - 112 mEq/L Final  . CO2 10/04/2013 27  19 - 32 mEq/L Final  . Glucose, Bld 10/04/2013 119* 70 - 99 mg/dL Final  . BUN 40/98/1191 31* 6 - 23 mg/dL Final  . Creatinine, Ser 10/04/2013 0.90  0.50 - 1.10 mg/dL Final  . Calcium 47/82/9562 10.1  8.4 - 10.5 mg/dL Final  . GFR calc non Af Amer 10/04/2013 63* >90 mL/min Final  . GFR calc Af Amer 10/04/2013 73* >90 mL/min Final   Comment: (  NOTE)                          The eGFR has  been calculated using the CKD EPI equation.                          This calculation has not been validated in all clinical situations.                          eGFR's persistently <90 mL/min signify possible Chronic Kidney                          Disease.  . WBC 10/04/2013 10.8* 4.0 - 10.5 K/uL Final  . RBC 10/04/2013 4.67  3.87 - 5.11 MIL/uL Final  . Hemoglobin 10/04/2013 13.5  12.0 - 15.0 g/dL Final  . HCT 95/62/1308 41.7  36.0 - 46.0 % Final  . MCV 10/04/2013 89.3  78.0 - 100.0 fL Final  . MCH 10/04/2013 28.9  26.0 - 34.0 pg Final  . MCHC 10/04/2013 32.4  30.0 - 36.0 g/dL Final  . RDW 65/78/4696 12.9  11.5 - 15.5 % Final  . Platelets 10/04/2013 279  150 - 400 K/uL Final    Recent Labs  10/10/13 0448  HGB 11.6*    Recent Labs  10/10/13 0448  WBC 25.9*  RBC 3.95  HCT 34.4*  PLT 242    Recent Labs  10/10/13 0448  NA 136  K 5.3*  CL 100  CO2 25  BUN 25*  CREATININE 0.89  GLUCOSE 209*  CALCIUM 9.5    Recent Labs  10/09/13 0942  INR 1.03    X-Rays:Dg Lumbar Spine 2-3 Views  10/04/2013   CLINICAL DATA:  72 year old female preoperative study for lumbar surgery. Initial encounter.  EXAM: LUMBAR SPINE - 2-3 VIEW  COMPARISON:  None.  FINDINGS: Normal lumbar segmentation with the lowest full size ribs at T11 and hypoplastic ribs at T12. Advanced disc space loss with vacuum phenomena and endplate spurring at L4-L5. Trace retrolisthesis at L2-L3 where there is also evidence of advanced disc disease. Endplate spurring elsewhere. Mild L3 loss of height appears to be chronic. sacral ala and SI joints within normal limits.  IMPRESSION: Normal lumbar segmentation. Intermittent advanced disc and endplate degeneration. The lumbar levels on these images were numbered in anticipation of surgery.   Electronically Signed   By: Augusto Gamble M.D.   On: 10/04/2013 11:20   Dg Spine Portable 1 View  10/09/2013   CLINICAL DATA:  Intraoperative film 3.  EXAM: PORTABLE SPINE - 1 VIEW   COMPARISON:  10/09/2013  FINDINGS: Lateral portable radiograph of the lumbar spine again demonstrates tissue spreaders posterior to the L3 through L5 vertebra. There are 3 surgical probes present. The most cranial probe is posterior to the L3 inferior endplate. The middle probe is in the projection of the L4 pedicle. The most caudal probe is posterior to the superior endplate of L5.  IMPRESSION: Surgical probes localize the L3, L4 and L5 vertebra.   Electronically Signed   By: Signa Kell M.D.   On: 10/09/2013 10:06   Dg Spine Portable 1 View  10/09/2013   CLINICAL DATA:  L3-L5 decompression, intraoperative spine radiographs  EXAM: PORTABLE SPINE - 1 VIEW  COMPARISON:  Intraoperative radiographs obtained earlier today at 8:34 a.m.  FINDINGS: Soft tissue spreaders are noted  in the tissues posterior to the L3-L5 level. The metallic spreader completely obscures the L4 and L5 spinous processes and partially obscures the L3 spinous process. Arrow markers are placed at the posterior aspect of the L1, L2 and L3 spinous processes as requested.  Two metallic probes are present. The more superior metallic probe projects over the L4 lamina, while the more inferior probe projects over the L5 lamina. Unchanged multilevel degenerative disc disease.  IMPRESSION: Intraoperative localization radiographs as detailed above.  Arrows were placed at the posterior margins of the visible spinous processes as requested. Please note that the metallic soft tissue spreader obscures the L4 and L5 spinous processes.   Electronically Signed   By: Malachy Moan M.D.   On: 10/09/2013 09:13   Dg Spine Portable 1 View  10/09/2013   CLINICAL DATA:  L3-L5 decompression  EXAM: PORTABLE SPINE - 1 VIEW  COMPARISON:  Preoperative radiographs 10/04/2013  FINDINGS: Metallic marking needles are identified in the soft tissues posterior to the spine. The more superior needle projects over the L3 spinous process. The more inferior needle is  interposition between the L4 and L5 spinous processes. There is a multilevel degenerative disc disease most significant at L4-L5. Lower lumbar facet arthropathy also noted.  IMPRESSION: Intraoperative spinal localization radiographs as above.   Electronically Signed   By: Malachy Moan M.D.   On: 10/09/2013 08:47    EKG: Orders placed during the hospital encounter of 08/13/13  . EKG 12-LEAD  . EKG 12-LEAD  . ED EKG  . ED EKG  . EKG     Hospital Course: Patient was admitted to [facility] and taken to the OR and underwent the above state procedure without complications.  Patient tolerated the procedure well and was later transferred to the recovery room and then to the orthopaedic floor for postoperative care.  They were given PO and IV analgesics for pain control following their surgery.  They were given 24 hours of postoperative antibiotics.   PT was consulted postop to assist with mobility and transfers.  The patient was allowed to be WBAT with therapy and was taught back precautions. Discharge planning was consulted to help with postop disposition and equipment needs.  Patient had a good night on the evening of surgery and started to get up OOB with therapy on day one. Patient was seen in rounds and was ready to go home on day one.  They were given discharge instructions and dressing directions.  They were instructed on when to follow up in the office with Dr. Shelle Iron.  Discharge Medications: Prior to Admission medications   Medication Sig Start Date End Date Taking? Authorizing Provider  acetaminophen (TYLENOL) 500 MG tablet Take 500 mg by mouth every 6 (six) hours as needed for pain.   Yes Historical Provider, MD  hydrochlorothiazide (HYDRODIURIL) 25 MG tablet Take 25 mg by mouth every morning.    Yes Historical Provider, MD  lisinopril (PRINIVIL,ZESTRIL) 5 MG tablet Take 5 mg by mouth every morning.    Yes Historical Provider, MD  HYDROcodone-acetaminophen (NORCO/VICODIN) 5-325  MG per tablet Take 1 tablet by mouth every 4 (four) hours as needed for pain. 10/09/13   Javier Docker, MD  traMADol (ULTRAM) 50 MG tablet Take 50 mg by mouth every 8 (eight) hours as needed for pain.    Historical Provider, MD  traMADol (ULTRAM) 50 MG tablet Take 1 tablet (50 mg total) by mouth every 6 (six) hours as needed for pain. 10/09/13  Javier Docker, MD    Diet: Regular diet Activity:WBAT Follow-up:in 10-14 days Disposition - Home Discharged Condition: good   Discharge Orders   Future Orders Complete By Expires   Call MD / Call 911  As directed    Comments:     If you experience chest pain or shortness of breath, CALL 911 and be transported to the hospital emergency room.  If you develope a fever above 101 F, pus (white drainage) or increased drainage or redness at the wound, or calf pain, call your surgeon's office.   Constipation Prevention  As directed    Comments:     Drink plenty of fluids.  Prune juice may be helpful.  You may use a stool softener, such as Colace (over the counter) 100 mg twice a day.  Use MiraLax (over the counter) for constipation as needed.   Diet - low sodium heart healthy  As directed    Increase activity slowly as tolerated  As directed        Medication List         acetaminophen 500 MG tablet  Commonly known as:  TYLENOL  Take 500 mg by mouth every 6 (six) hours as needed for pain.     hydrochlorothiazide 25 MG tablet  Commonly known as:  HYDRODIURIL  Take 25 mg by mouth every morning.     HYDROcodone-acetaminophen 5-325 MG per tablet  Commonly known as:  NORCO/VICODIN  Take 1 tablet by mouth every 4 (four) hours as needed for pain.     lisinopril 5 MG tablet  Commonly known as:  PRINIVIL,ZESTRIL  Take 5 mg by mouth every morning.     traMADol 50 MG tablet  Commonly known as:  ULTRAM  Take 1 tablet (50 mg total) by mouth every 6 (six) hours as needed for pain.     traMADol 50 MG tablet  Commonly known as:  ULTRAM  Take 50  mg by mouth every 8 (eight) hours as needed for pain.           Follow-up Information   Follow up with BEANE,JEFFREY C, MD In 2 weeks.   Specialty:  Orthopedic Surgery   Contact information:   [address] [place] [address] [phone]       Signed: Dorothy Spark. 10/10/2013, 1:01 PM

## 2013-10-10 NOTE — Evaluation (Signed)
Occupational Therapy Evaluation Patient Details Name: Shannon Stephenson MRN: [id number] DOB: Jun 02, 1941 Today's Date: 10/10/2013 Time: 1001-1019 OT Time Calculation (min): 18 min  OT Assessment / Plan / Recommendation History of present illness post decompression  for spinal stenosis L3-4, L45   Clinical Impression   Pt very drowsy and does not speak [place].  Education in back precautions related to ADL provided through daughter's interpretation and to daughter. No further OT needs.  Pt is performing bed mobility with supervision and ADL transfers and mobility with min guard assist.  Will have 24 hour assist of daughter.    OT Assessment  Patient does not need any further OT services    Follow Up Recommendations  No OT follow up;Supervision/Assistance - 24 hour    Barriers to Discharge      Equipment Recommendations  None recommended by OT    Recommendations for Other Services    Frequency       Precautions / Restrictions Precautions Precautions: Back;Fall Precaution Comments: instructed pt and daughter in back precautions related to ADL and bed mobility Restrictions Weight Bearing Restrictions: No   Pertinent Vitals/Pain VSS, pain 8/10 with movement, 2/10 in supine, RN made aware    ADL  Eating/Feeding: Independent Where Assessed - Eating/Feeding: Chair Grooming: Wash/dry hands;Min guard Where Assessed - Grooming: Unsupported standing Upper Body Bathing: Supervision/safety Where Assessed - Upper Body Bathing: Unsupported sitting Lower Body Bathing: Minimal assistance Where Assessed - Lower Body Bathing: Unsupported sitting;Supported sit to stand Upper Body Dressing: Set up Where Assessed - Upper Body Dressing: Unsupported sitting Lower Body Dressing: Minimal assistance Where Assessed - Lower Body Dressing: Unsupported sitting;Supported sit to stand Toilet Transfer: Min Pension scheme manager Method: Sit to Barista: Comfort height toilet;Grab  bars Toileting - Architect and Hygiene: Min guard Where Assessed - Engineer, mining and Hygiene: Sit to stand from 3-in-1 or toilet Equipment Used: Rolling walker;Sock aid;Reacher;Long-handled sponge;Gait belt Transfers/Ambulation Related to ADLs: minguard assist with RW ADL Comments: Pt not quite able to cross her foot over her opposite knee to donn socks due to back pain.  Could do this prior to surgery.  Educated pt and daughter in use of AE.  Daughter to purchase long sponge and provide assist to pt for LB dressing.  Instructed in pericare avoiding twisting.  Practiced log roll technique for return to bed.  Pt very sleepy.    OT Diagnosis:    OT Problem List:   OT Treatment Interventions:     OT Goals(Current goals can be found in the care plan section) Acute Rehab OT Goals Patient Stated Goal: Via daughter- to go home  Visit Information  Last OT Received On: 10/10/13 Assistance Needed: +1 History of Present Illness: post decompression  for spinal stenosis L3-4, L45       Prior Functioning     Home Living Family/patient expects to be discharged to:: Private residence Living Arrangements: Children Available Help at Discharge: Family Type of Home: House Home Access: Stairs to enter Secretary/administrator of Steps: 2 Entrance Stairs-Rails: None Home Layout: One level Home Equipment: None Additional Comments: Instructed in availability of shower seat or alternatives pt may have at home for seated showering, will likely be able to stand and hold soap dish. Prior Function Level of Independence: Independent Communication Communication: Prefers language other than [place];Interpreter utilized ([place] Dominant Hand: Right         Vision/Perception     Cognition  Cognition Arousal/Alertness: Lethargic;Suspect due to medications Behavior During  Therapy: WFL for tasks assessed/performed Overall Cognitive Status: Within Functional Limits for  tasks assessed    Extremity/Trunk Assessment Upper Extremity Assessment Upper Extremity Assessment: Overall WFL for tasks assessed Lower Extremity Assessment Lower Extremity Assessment: Defer to PT evaluation Cervical / Trunk Assessment Cervical / Trunk Assessment: Normal     Mobility Bed Mobility Bed Mobility: Sit to Sidelying Left;Rolling Right Rolling Right: 5: Supervision Rolling Left: 5: Supervision Left Sidelying to Sit: 4: Min guard Sit to Sidelying Left: 5: Supervision Details for Bed Mobility Assistance: cues for log rolling and sitting up. Daughter was able to verbalize the technique before Pt performed. Transfers Sit to Stand: 4: Min guard;With upper extremity assist Stand to Sit: 4: Min guard;With upper extremity assist Details for Transfer Assistance: visual cues to stand from bed and reach back to armrests.     Exercise     Balance Balance Balance Assessed: Yes Static Standing Balance Static Standing - Balance Support: Bilateral upper extremity supported Static Standing - Level of Assistance: 5: Stand by assistance   End of Session OT - End of Session Activity Tolerance: Patient limited by lethargy Patient left: in bed;with call bell/phone within reach;with family/visitor present Nurse Communication: Mobility status (pain level, pt urinated)  GO Functional Assessment Tool Used: clinical judgement Functional Limitation: Self care Self Care Current Status (Z6109): At least 1 percent but less than 20 percent impaired, limited or restricted Self Care Goal Status (U0454): At least 1 percent but less than 20 percent impaired, limited or restricted Self Care Discharge Status [phone]): At least 1 percent but less than 20 percent impaired, limited or restricted   Evern Bio 10/10/2013, 10:33 AM [phone]

## 2013-10-10 NOTE — Op Note (Signed)
Shannon Stephenson, Shannon Stephenson               ACCOUNT NO.:  [id number]  MEDICAL RECORD NO.:  [id number]  LOCATION:  1617                         FACILITY:  [facility]  PHYSICIAN:  Jene Every, M.D.    DATE OF BIRTH:  [birth year]  DATE OF PROCEDURE:  10/09/2013 DATE OF DISCHARGE:                              OPERATIVE REPORT   PREOPERATIVE DIAGNOSIS:  Spinal stenosis L3-4, 4-5.  POSTOPERATIVE DIAGNOSIS:  Spinal stenosis L3-4, 4-5.  PROCEDURE PERFORMED: 1. Microlumbar decompression, L3-4, L4-5. 2. Bilateral foraminotomies of L3, L4, L5. 3. Gill laminectomy of 4.  ANESTHESIA:  General.  ASSISTANT:  Lanna Poche, PA  HISTORY:  The patient is a 72 year old with neurogenic claudication and severe lower extremity radicular pain secondary to spinal stenosis, severe at 3-4 and lateral recess at 4-5.  She was indicated for decompression failing conservative treatment for neurologic deficit. Risk and benefits discussed including bleeding, infection, and damage to vascular structures, DVT, PE anesthetic complications, no change in symptoms, worsening symptoms, etc.  We discussed this with her family with translators.  TECHNIQUE:  With the patient in supine position, after induction of adequate anesthesia 2 g Kefzol, placed prone on the Zearing frame. Foley to gravity.  All bony surfaces were well padded.  Lumbar region was prepped and draped in usual sterile fashion.  Two 18-gauge spinal needle was utilized to localize 3-4, 4-5 interspace, confirmed with x- ray.  Incision was made from just above the spinous process of 3 to below the spinous process of 5.  Subcutaneous tissue was dissected, electrocautery was utilized to achieve hemostasis.  Dorsolumbar fascia identified and divided in line with skin incision.  0.25% Marcaine with epinephrine was infiltrated in the paraspinous musculature.  Paraspinous muscle was elevated from lamina of 3-4, 4-5.  McCullough retractor was placed.  Confirmatory  radiograph obtained.  Operating microscope was draped and brought in the surgical field.  Leksell rongeur utilized to remove the spinous processes of 3 and 4, partial 5.  We used a small straight curette to detach the ligamentum flavum from the caudad edge of 3 and entered the canal with a 2-mm Kerrison cephalad and then also from the cephalad edge of 4 detachment with a straight microcurette.  Neuro patty placed beneath the ligamentum flavum.  Ligamentum flavum was gently removed.  Significant stenosis was noted here both centrally and laterally.  We continued with the central decompression and removal of the lamina of 4, and at that point, we continued to remove the lamina of 4 in the central portion of the hypertrophic ligamentum at 4-5. Ligamentum flavum detached from the cephalad edge of 5 as well.  I encountered a fair amount of osseous bleeding that was controlled meticulously throughout the case __________ bone wax, thrombin-soaked Gelfoam, optimizing anesthesia, etc.  After performing the central laminectomy at L4, we proceeded from both sides of the operating room table, decompressed the lateral recesses severely, compressed bilaterally both 3-4 and 4-5.  We decompressed the lateral recess to the medial border of the pedicle.  Performed the foraminotomies bilaterally at L3, L4, and L5.  Particularly stenotic at L5 on the right.  Bipolar electrocautery was utilized to achieve hemostasis.  Thrombin-soaked Gelfoam irrigation throughout  the case.  We then used FloSeal as an adjunct as well and the mid portion of the case __________ half of the FloSeal, waiting for confirmatory radiograph and achieving hemostasis. Confirmatory radiographs obtained showed the foramen of 3 and 5.  No disk herniation was noted bilaterally with inspection.  Good restoration of thecal sac was noted bilaterally and a Woodson retractor probe passed freely up the foramen of 3, 4, and 5 bilaterally  __________ decompression.  No evidence of CSF leakage or active bleeding was noted. The goal had been performed of 4.  Again, bipolar electrocautery was utilized to achieve hemostasis.  After copious irrigation, again inspection revealed no CSF leak or direct bleeding.  We placed thrombin- soaked Gelfoam in the laminotomy defect and then the remainder of the FloSeal in the paraspinous musculature.  We released the [facility] retractor, irrigated the paraspinous tissues, and achieved hemostasis. We closed the fascia with #1 Vicryl interrupted figure-of-eight sutures. Small aperture was left distally __________ subcu with 2-0, again an aperture distally, and skin with staples.  Wound was dressed sterilely. Placed supine on hospital bed, extubated without difficulty, transported to recovery room in a satisfactory condition.  The patient tolerated the procedure well.  No complications.  We did send off the intraoperative coags completed by the conclusion of the case.  350 mL was noted in blood loss.     Jene Every, M.D.     Cordelia Pen  D:  10/09/2013  T:  10/10/2013  Job:  [id number]

## 2013-10-10 NOTE — Evaluation (Addendum)
Physical Therapy Evaluation Patient Details Name: Shannon Stephenson MRN: [id number] DOB: 1941/03/09 Today's Date: 10/10/2013 Time: 7829-5621 PT Time Calculation (min): 32 min  PT Assessment / Plan / Recommendation History of Present Illness  post decompression  for spinal stenosis L3-4, L45  Clinical Impression  Pt is very drowsy but able to ambulate x 60'.  Pt's daughter present and interpreted. Pt will DC to home of daughter. Pt will need a youth RW. Recommend HHPT. Pt will benefit froom PT to address problems listed.    PT Assessment      Follow Up Recommendations  Home health PT    Does the patient have the potential to tolerate intense rehabilitation      Barriers to Discharge        Equipment Recommendations  Rolling walker with 5" wheels (youth)    Recommendations for Other Services     Frequency   5x   Precautions / Restrictions Precautions Precautions: Back Precaution Comments: provided handout . Daughter speaks/reads English Restrictions Weight Bearing Restrictions:  (back precautions)   Pertinent Vitals/Pain 3 in back-daughter to call RN if pain increases.      Mobility  Bed Mobility Bed Mobility: Rolling Left;Left Sidelying to Sit Rolling Left: 5: Supervision Left Sidelying to Sit: 4: Min guard Details for Bed Mobility Assistance: cues for log rolling and sitting up. Daughter was able to verbalize the technique before Pt performed. Transfers Transfers: Sit to Stand;Stand to Sit Sit to Stand: 4: Min guard;From bed Stand to Sit: 4: Min guard;To chair/3-in-1;With upper extremity assist Details for Transfer Assistance: visual cues to stand from bed and reach back to armrests. Ambulation/Gait Ambulation/Gait Assistance: 4: Min assist Ambulation Distance (Feet): 60 Feet Assistive device: Rolling walker Ambulation/Gait Assistance Details: Pt walks slowly, safely. Pt apppears very drowsy. Pt stated" Why am I so Sleepy". Gait Pattern: Step-through  pattern;Decreased stride length    Exercises     PT Diagnosis:   pain, PT Problem List:  pain,decreased mobility PT Treatment Interventions:   gait training, stair training.    PT Goals(Current goals can be found in the care plan section) Acute Rehab PT Goals Patient Stated Goal: Via daughter- to go home PT Goal Formulation: With patient/family Time For Goal Achievement: 10/12/13 Potential to Achieve Goals: Good  Visit Information  Last PT Received On: 10/10/13 Assistance Needed: +1 History of Present Illness: post decompression  for spinal stenosis L3-4, L45       Prior Functioning  Home Living Family/patient expects to be discharged to:: Private residence Living Arrangements: Children (will stay with daughter.) Available Help at Discharge: Family Type of Home: House Home Access: Stairs to enter Secretary/administrator of Steps: 2 Entrance Stairs-Rails: None Home Layout: One level Home Equipment:  (master bath has comfort height toilet. ) Additional Comments: maser bedroom has very tall bed. Has a Foton and recommend that pt sleep on it. Bathroom close to it has regular height toilet. Prior Function Level of Independence: Independent Communication Communication: Prefers language other than [place];Interpreter utilized (daughter interpreted for pt.)    Cognition  Cognition Arousal/Alertness: Administrator, Civil Service;Suspect due to medications Behavior During Therapy: [facility] for tasks assessed/performed Overall Cognitive Status: Within Functional Limits for tasks assessed    Extremity/Trunk Assessment Upper Extremity Assessment Upper Extremity Assessment: Defer to OT evaluation Lower Extremity Assessment Lower Extremity Assessment: Overall WFL for tasks assessed Cervical / Trunk Assessment Cervical / Trunk Assessment: Normal   Balance Balance Balance Assessed: Yes Static Standing Balance Static Standing - Balance Support: Bilateral upper extremity  supported Static Standing - Level  of Assistance: 5: Stand by assistance  End of Session PT - End of Session Activity Tolerance: Patient tolerated treatment well Patient left: in chair;with call bell/phone within reach;with family/visitor present Nurse Communication: Mobility status  GP     Rada Hay 10/10/2013, 9:59 AM Blanchard Kelch PT [phone]

## 2013-10-10 NOTE — Progress Notes (Signed)
[facility]  [facility] is providing the following services: RW  If patient discharges after hours, please call [phone].   Shannon Stephenson 10/10/2013, 11:02 AM

## 2013-10-10 NOTE — Care Management Note (Signed)
    Page 1 of 1   10/10/2013     3:10:48 PM   CARE MANAGEMENT NOTE 10/10/2013  Patient:  Shannon Stephenson, Shannon Stephenson   Account Number:  [id number]  Date Initiated:  10/10/2013  Documentation initiated by:  Colleen Can  Subjective/Objective Assessment:   dx stenosis L3-L5: Micro-lumbar decompression L3-L5 bil     Action/Plan:   Home with daughter as caregiver.   Anticipated DC Date:  10/10/2013   Anticipated DC Plan:  HOME/SELF CARE  In-house referral  Clinical Social Worker      DC Planning Services  CM consult      [facility] Choice  DURABLE MEDICAL EQUIPMENT   Choice offered to / List presented to:     DME arranged  [facility]ALK[facility] - Orlena Sheldon      DME agency  [facility]        Status of service:  Completed, signed off Medicare Important Message given?   (If response is "NO", the following Medicare IM given date fields will be blank) Date Medicare IM given:   Date Additional Medicare IM given:    Discharge Disposition:  HOME/SELF CARE  Per UR Regulation:  Reviewed for med. necessity/level of care/duration of stay  If discussed at Long Length of Stay Meetings, dates discussed:    Comments:

## 2013-10-10 NOTE — Progress Notes (Signed)
10/10/13 0959  PT Time Calculation  PT Start Time 0911  PT Stop Time 0943  PT Time Calculation (min) 32 min  PT G-Codes **NOT FOR INPATIENT CLASS**  Functional Assessment Tool Used clinical judgement.  Functional Limitation Mobility: Walking and moving around  Mobility: Walking and Moving Around Current Status [phone]) CJ  Mobility: Walking and Moving Around Goal Status (U0454) CI  PT General Charges  $$ ACUTE PT VISIT 1 Procedure  PT Treatments  $Gait Training 8-22 mins  $Self Care/Home Management 8-22  .sign

## 2013-10-10 NOTE — Progress Notes (Signed)
Subjective: 1 Day Post-Op Procedure(s) (LRB): MICRO LUMBAR DECOMPRESSION L3-L5 (2 LEVELS) (Bilateral) Patient reports pain as moderate.  Has not yet been OOB. Her daughter is here translating for her. Seen in AM rounds by myself and Dr. Shelle Iron. Her foley was removed this morning. Apparently there was leaking from the foley overnight, not thought to be overflow as her output levels were good. Her dressing became wet and was cleaned and changed following that.   Objective: Vital signs in last 24 hours: Temp:  [96.3 F (35.7 C)-98.3 F (36.8 C)] 97.9 F (36.6 C) (10/30 0809) Pulse Rate:  [67-92] 67 (10/30 0809) Resp:  [9-18] 16 (10/30 0527) BP: (106-150)/(59-74) 127/69 mmHg (10/30 0809) SpO2:  [96 %-100 %] 100 % (10/30 0809) Weight:  [72.5 kg (159 lb 13.3 oz)] 72.5 kg (159 lb 13.3 oz) (10/29 1321)  Intake/Output from previous day: 10/29 0701 - 10/30 0700 In: 3836.7 [P.O.:600; I.V.:3236.7] Out: 3050 [Urine:2650; Blood:400] Intake/Output this shift: Total I/O In: -  Out: 475 [Urine:475]   Recent Labs  10/10/13 0448  HGB 11.6*    Recent Labs  10/10/13 0448  WBC 25.9*  RBC 3.95  HCT 34.4*  PLT 242    Recent Labs  10/10/13 0448  NA 136  K 5.3*  CL 100  CO2 25  BUN 25*  CREATININE 0.89  GLUCOSE 209*  CALCIUM 9.5    Recent Labs  10/09/13 0942  INR 1.03    Neurologically intact ABD soft Neurovascular intact Sensation intact distally Intact pulses distally Dorsiflexion/Plantar flexion intact Incision: dressing C/D/I and no drainage No cellulitis present Compartment soft no calf pain or sign of DVT  Assessment/Plan: 1 Day Post-Op Procedure(s) (LRB): MICRO LUMBAR DECOMPRESSION L3-L5 (2 LEVELS) (Bilateral) Up with therapy Foley already removed Bladder scan if difficulty voiding To be seen by PT this AM Discussed D/C instructions, Lspine precautions Aquacel dressing placed If pain well controlled plan for D/C later today, otherwise plan for D/C  tomorrow  Andrez Grime M. 10/10/2013, 9:16 AM

## 2013-10-11 LAB — PLATELET FUNCTION ASSAY: Collagen / Epinephrine: 119 seconds (ref 0–184)

## 2013-10-11 NOTE — Progress Notes (Signed)
Utilization review completed.  

## 2015-08-03 ENCOUNTER — Other Ambulatory Visit: Payer: Self-pay

## 2015-08-06 ENCOUNTER — Other Ambulatory Visit: Payer: Self-pay | Admitting: Family Medicine

## 2015-08-06 DIAGNOSIS — E2839 Other primary ovarian failure: Secondary | ICD-10-CM

## 2015-08-06 DIAGNOSIS — Z1231 Encounter for screening mammogram for malignant neoplasm of breast: Secondary | ICD-10-CM

## 2015-08-11 ENCOUNTER — Other Ambulatory Visit: Payer: Self-pay | Admitting: Family Medicine

## 2015-08-13 ENCOUNTER — Ambulatory Visit
Admission: RE | Admit: 2015-08-13 | Discharge: 2015-08-13 | Disposition: A | Discharge status: Home / Self Care | Payer: Medicare Other | Source: Ambulatory Visit | Attending: Family Medicine | Admitting: Family Medicine

## 2015-08-13 DIAGNOSIS — Z1231 Encounter for screening mammogram for malignant neoplasm of breast: Secondary | ICD-10-CM

## 2015-08-13 DIAGNOSIS — E2839 Other primary ovarian failure: Secondary | ICD-10-CM

## 2017-01-10 ENCOUNTER — Other Ambulatory Visit: Payer: Self-pay | Admitting: Family Medicine

## 2017-01-10 DIAGNOSIS — Z1231 Encounter for screening mammogram for malignant neoplasm of breast: Secondary | ICD-10-CM

## 2017-01-24 ENCOUNTER — Emergency Department (HOSPITAL_COMMUNITY)
Admission: EM | Admit: 2017-01-24 | Discharge: 2017-01-24 | Disposition: A | Discharge status: Home / Self Care | Payer: Medicare Other | Attending: Emergency Medicine | Admitting: Emergency Medicine

## 2017-01-24 ENCOUNTER — Encounter (HOSPITAL_COMMUNITY): Payer: Self-pay

## 2017-01-24 DIAGNOSIS — I10 Essential (primary) hypertension: Secondary | ICD-10-CM | POA: Diagnosis not present

## 2017-01-24 DIAGNOSIS — Z79899 Other long term (current) drug therapy: Secondary | ICD-10-CM | POA: Insufficient documentation

## 2017-01-24 DIAGNOSIS — E119 Type 2 diabetes mellitus without complications: Secondary | ICD-10-CM | POA: Diagnosis not present

## 2017-01-24 DIAGNOSIS — N938 Other specified abnormal uterine and vaginal bleeding: Secondary | ICD-10-CM | POA: Insufficient documentation

## 2017-01-24 DIAGNOSIS — N939 Abnormal uterine and vaginal bleeding, unspecified: Secondary | ICD-10-CM

## 2017-01-24 HISTORY — DX: Type 2 diabetes mellitus without complications: E11.9

## 2017-01-24 LAB — URINALYSIS, ROUTINE W REFLEX MICROSCOPIC
Bilirubin Urine: NEGATIVE
Glucose, UA: NEGATIVE mg/dL
Ketones, ur: NEGATIVE mg/dL
Leukocytes, UA: NEGATIVE
Nitrite: NEGATIVE
Protein, ur: NEGATIVE mg/dL
SPECIFIC GRAVITY, URINE: 1.012 (ref 1.005–1.030)
pH: 6 (ref 5.0–8.0)

## 2017-01-24 MED ORDER — ESTROGENS, CONJUGATED 0.625 MG/GM VA CREA: 1.0000 | TOPICAL_CREAM | Freq: Every day | VAGINAL | 0 refills | Status: DC

## 2017-01-24 NOTE — ED Triage Notes (Signed)
Patient complains of vaginal bleeding x 1 day, no abdominal pain, no cramping, denies rectal bleeding.

## 2017-01-24 NOTE — ED Provider Notes (Signed)
[facility]-EMERGENCY DEPT Provider Note   CSN: [id number] Arrival date & time: 01/24/17  1413     History   Chief Complaint Chief Complaint  Patient presents with  . Vaginal Bleeding    HPI Shannon Stephenson is a 76 y.o. female.  HPI  76 y.o. female with a hx of DM, HTN, presents to the Emergency Department today complaining of vaginal bleeding with onset today. Notes no abdominal pain. No cramping. No vaginal discharge. No itching. No pain. States that the bleeding that started yesterday has slowly improved today. Menopause was when Shannon Stephenson was 76 years old. Pt is not sexually active. No fevers. No other symptoms noted.   Past Medical History:  Diagnosis Date  . Back pain   . Diabetes mellitus without complication (HCC)   . Hypertension     Patient Active Problem List   Diagnosis Date Noted  . Spinal stenosis of lumbar region 10/09/2013    Past Surgical History:  Procedure Laterality Date  . LUMBAR LAMINECTOMY/DECOMPRESSION MICRODISCECTOMY Bilateral 10/09/2013   Procedure: MICRO LUMBAR DECOMPRESSION L3-L5 (2 LEVELS);  Surgeon: Javier DockerJeffrey C Beane, MD;  Location: [facility] ORS;  Service: Orthopedics;  Laterality: Bilateral;  . NO PAST SURGERIES      OB History    No data available       Home Medications    Prior to Admission medications   Medication Sig Start Date End Date Taking? Authorizing Provider  acetaminophen (TYLENOL) 500 MG tablet Take 500 mg by mouth every 6 (six) hours as needed for pain.    Historical Provider, MD  hydrochlorothiazide (HYDRODIURIL) 25 MG tablet Take 25 mg by mouth every morning.     Historical Provider, MD  HYDROcodone-acetaminophen (NORCO/VICODIN) 5-325 MG per tablet Take 1 tablet by mouth every 4 (four) hours as needed for pain. 10/09/13   Jene EveryJeffrey Beane, MD  lisinopril (PRINIVIL,ZESTRIL) 5 MG tablet Take 5 mg by mouth every morning.     Historical Provider, MD  traMADol (ULTRAM) 50 MG tablet Take 50 mg by mouth every 8 (eight) hours as needed for pain.     Historical Provider, MD  traMADol (ULTRAM) 50 MG tablet Take 1 tablet (50 mg total) by mouth every 6 (six) hours as needed for pain. 10/09/13   Jene EveryJeffrey Beane, MD    Family History No family history on file.  Social History Social History  Substance Use Topics  . Smoking status: Never Smoker  . Smokeless tobacco: Never Used  . Alcohol use No     Allergies   Patient has no known allergies.   Review of Systems Review of Systems ROS reviewed and all are negative for acute change except as noted in the HPI.  Physical Exam Updated Vital Signs BP 170/77   Pulse 72   Temp 98.2 F (36.8 C) (Oral)   Resp 16   SpO2 100%   Physical Exam  Constitutional: Shannon Stephenson is oriented to person, place, and time. Vital signs are normal. Shannon Stephenson appears well-developed and well-nourished.  HENT:  Head: Normocephalic and atraumatic.  Right Ear: Hearing normal.  Left Ear: Hearing normal.  Eyes: Conjunctivae and EOM are normal. Pupils are equal, round, and reactive to light.  Neck: Normal range of motion. Neck supple.  Cardiovascular: Normal rate, regular rhythm, normal heart sounds and intact distal pulses.   Pulmonary/Chest: Effort normal and breath sounds normal.  Abdominal: Soft. Normal appearance and bowel sounds are normal. There is no tenderness. There is no rigidity, no rebound, no guarding, no CVA tenderness, no  tenderness at McBurney's point and negative Murphy's sign.  Musculoskeletal: Normal range of motion.  Neurological: Shannon Stephenson is alert and oriented to person, place, and time.  Skin: Skin is warm and dry.  Psychiatric: Shannon Stephenson has a normal mood and affect. Her speech is normal and behavior is normal. Thought content normal.  Nursing note and vitals reviewed.  Exam performed by Eston Esters,  exam chaperoned Date: 01/24/2017 Pelvic exam: normal external genitalia without evidence of trauma. VULVA: normal appearing vulva with no masses, tenderness or lesion. VAGINA: normal appearing vagina with  normal color and discharge, no lesions. CERVIX: normal appearing cervix without lesions, cervical motion tenderness absent, cervical os closed with out purulent discharge; vaginal discharge - bloody, Wet prep and DNA probe for chlamydia and GC obtained.   ADNEXA: normal adnexa in size, nontender and no masses UTERUS: uterus is normal size, shape, consistency and nontender.   ED Treatments / Results  Labs (all labs ordered are listed, but only abnormal results are displayed) Labs Reviewed  URINALYSIS, ROUTINE W REFLEX MICROSCOPIC - Abnormal; Notable for the following:       Result Value   Color, Urine STRAW (*)    Hgb urine dipstick LARGE (*)    Bacteria, UA RARE (*)    Squamous Epithelial / LPF 0-5 (*)    All other components within normal limits   EKG  EKG Interpretation None      Radiology No results found.  Procedures Procedures (including critical care time)  Medications Ordered in ED Medications - No data to display   Initial Impression / Assessment and Plan / ED Course  I have reviewed the triage vital signs and the nursing notes.  Pertinent labs & imaging results that were available during my care of the patient were reviewed by me and considered in my medical decision making (see chart for details).    Final Clinical Impressions(s) / ED Diagnoses  {I have reviewed and evaluated the relevant laboratory values.   {I have reviewed the relevant previous healthcare records.  {I obtained HPI from historian. {Patient discussed with supervising physician.  ED Course:  Assessment: Pt is a 76yF with hx HTN, DM who presents with vaginal bleeding x 1 day. No abdominal pain or cramping. No discharge. No itching. No N/V. No fevers. On exam, pt in NAD. Nontoxic/nonseptic appearing. VSS. Afebrile. Lungs CTA. Heart RRR. Abdomen nontender soft. GU exam with bleeding from cervix noted. No discharge. UA unremarkable. Plan is to DC home with follow up with [facility]. Possible  malignancy vs postmenopausal vaginal bleeding. At time of discharge, Patient is in no acute distress. Vital Signs are stable. Patient is able to ambulate. Patient able to tolerate PO.   Disposition/Plan:  DC Home Additional Verbal discharge instructions given and discussed with patient.  Pt Instructed to f/u with OBGYN in the next week for evaluation and treatment of symptoms. Return precautions given Pt acknowledges and agrees with plan  Supervising Physician Gerhard Munch, MD  Final diagnoses:  Vaginal bleeding    New Prescriptions New Prescriptions   No medications on file     Audry Pili, PA-C 01/24/17 1724    Gerhard Munch, MD 01/24/17 2328

## 2017-01-24 NOTE — Discharge Instructions (Signed)
Please read and follow all provided instructions.  Your diagnoses today include:  1. Vaginal bleeding     Tests performed today include: Vital signs. See below for your results today.   Medications prescribed:  Take as prescribed   Home care instructions:  Follow any educational materials contained in this packet.  Follow-up instructions: Please follow-up with OBGYN for further evaluation of symptoms and treatment   Return instructions:  Please return to the Emergency Department if you do not get better, if you get worse, or new symptoms OR  - Fever (temperature greater than 101.66F)  - Bleeding that does not stop with holding pressure to the area    -Severe pain (please note that you may be more sore the day after your accident)  - Chest Pain  - Difficulty breathing  - Severe nausea or vomiting  - Inability to tolerate food and liquids  - Passing out  - Skin becoming red around your wounds  - Change in mental status (confusion or lethargy)  - New numbness or weakness    Please return if you have any other emergent concerns.  Additional Information:  Your vital signs today were: BP 170/77    Pulse 72    Temp 98.2 F (36.8 C) (Oral)    Resp 16    SpO2 100%  If your blood pressure (BP) was elevated above 135/85 this visit, please have this repeated by your doctor within one month. ---------------

## 2017-02-08 ENCOUNTER — Encounter: Payer: Self-pay | Admitting: Obstetrics & Gynecology

## 2017-02-08 ENCOUNTER — Ambulatory Visit (INDEPENDENT_AMBULATORY_CARE_PROVIDER_SITE_OTHER): Payer: Medicare Other | Admitting: Obstetrics & Gynecology

## 2017-02-08 ENCOUNTER — Other Ambulatory Visit (HOSPITAL_COMMUNITY)
Admission: RE | Admit: 2017-02-08 | Discharge: 2017-02-08 | Disposition: A | Discharge status: Home / Self Care | Payer: Medicare Other | Source: Ambulatory Visit | Attending: Obstetrics & Gynecology | Admitting: Obstetrics & Gynecology

## 2017-02-08 VITALS — BP 174/85 | HR 74 | Wt 157.5 lb

## 2017-02-08 DIAGNOSIS — N950 Postmenopausal bleeding: Secondary | ICD-10-CM

## 2017-02-08 DIAGNOSIS — Z1231 Encounter for screening mammogram for malignant neoplasm of breast: Secondary | ICD-10-CM

## 2017-02-08 NOTE — Progress Notes (Signed)
Language Resources Interpreter [employer]

## 2017-02-08 NOTE — Progress Notes (Signed)
   Subjective:    Patient ID: Shannon ChuPhuong T Stephenson, female    DOB: 02/27/1941, 76 y.o.   MRN: [id number]  HPI  76 yo [place] lady here for 1 occasion of PMB. She does not take HRT.  Review of Systems     Objective:   Physical Exam  Moderately obese pleasant A woman NAD Breathing, conversing, (with interpretor), and ambulating normally Abd- benign Vulva/vagina- normal Cervix- normal, parous  UPT negative, consent signed, time out done Cervix prepped with betadine and grasped with a single tooth tenaculum Uterus sounded to 9 cm Pipelle used for 2 passes with a small amount of tissue obtained. She tolerated the procedure well.      Assessment & Plan:  PMB- await pathology results, u/s scheduled 3-7 Preventative care- mammogram ordered

## 2017-02-15 ENCOUNTER — Telehealth: Payer: Self-pay

## 2017-02-15 ENCOUNTER — Ambulatory Visit (HOSPITAL_COMMUNITY)
Admission: RE | Admit: 2017-02-15 | Discharge: 2017-02-15 | Disposition: A | Discharge status: Home / Self Care | Payer: Medicare Other | Source: Ambulatory Visit | Attending: Obstetrics & Gynecology | Admitting: Obstetrics & Gynecology

## 2017-02-15 DIAGNOSIS — N950 Postmenopausal bleeding: Secondary | ICD-10-CM | POA: Diagnosis present

## 2017-02-15 DIAGNOSIS — N854 Malposition of uterus: Secondary | ICD-10-CM | POA: Insufficient documentation

## 2017-02-15 NOTE — Telephone Encounter (Signed)
Patient has been notified of the negative bisopy results.

## 2017-02-28 ENCOUNTER — Ambulatory Visit
Admission: RE | Admit: 2017-02-28 | Discharge: 2017-02-28 | Disposition: A | Discharge status: Home / Self Care | Payer: Medicare Other | Source: Ambulatory Visit | Attending: Obstetrics & Gynecology | Admitting: Obstetrics & Gynecology

## 2017-02-28 ENCOUNTER — Ambulatory Visit: Payer: Medicare Other

## 2017-02-28 DIAGNOSIS — N950 Postmenopausal bleeding: Secondary | ICD-10-CM

## 2017-02-28 DIAGNOSIS — Z1231 Encounter for screening mammogram for malignant neoplasm of breast: Secondary | ICD-10-CM

## 2017-03-01 ENCOUNTER — Ambulatory Visit (INDEPENDENT_AMBULATORY_CARE_PROVIDER_SITE_OTHER): Payer: Medicare Other | Admitting: Obstetrics & Gynecology

## 2017-03-01 ENCOUNTER — Encounter: Payer: Self-pay | Admitting: Obstetrics & Gynecology

## 2017-03-01 VITALS — BP 159/60 | HR 75 | Wt 159.0 lb

## 2017-03-01 DIAGNOSIS — N950 Postmenopausal bleeding: Secondary | ICD-10-CM | POA: Diagnosis not present

## 2017-03-01 NOTE — Progress Notes (Signed)
   Subjective:    Patient ID: Shannon Stephenson, female    DOB: 01/21/1941, 76 y.o.   MRN: [id number]  HPI 76 yo [place] lady is here for follow up after a EMBX done for PMB. Her u/s showed her endometrium to be 3 mm with some fibroids. Her EMBX was negative.    Review of Systems Her mammogram was normal.    Objective:   Physical Exam  Pleasant Asian lady WNWHNAD Breathing, conversing, and ambulating normally      Assessment & Plan:  PMB with atrophic endometrium and normal EMBX. Reassurance given

## 2020-06-04 ENCOUNTER — Other Ambulatory Visit: Payer: Self-pay | Admitting: Family Medicine

## 2020-06-04 DIAGNOSIS — M810 Age-related osteoporosis without current pathological fracture: Secondary | ICD-10-CM

## 2021-04-06 ENCOUNTER — Other Ambulatory Visit: Payer: Self-pay

## 2021-04-06 ENCOUNTER — Emergency Department (HOSPITAL_COMMUNITY): Payer: Medicare Other

## 2021-04-06 ENCOUNTER — Emergency Department (HOSPITAL_COMMUNITY)
Admission: EM | Admit: 2021-04-06 | Discharge: 2021-04-06 | Disposition: A | Discharge status: Home / Self Care | Payer: Medicare Other | Attending: Emergency Medicine | Admitting: Emergency Medicine

## 2021-04-06 DIAGNOSIS — J189 Pneumonia, unspecified organism: Secondary | ICD-10-CM

## 2021-04-06 DIAGNOSIS — I10 Essential (primary) hypertension: Secondary | ICD-10-CM | POA: Diagnosis not present

## 2021-04-06 DIAGNOSIS — E119 Type 2 diabetes mellitus without complications: Secondary | ICD-10-CM | POA: Diagnosis not present

## 2021-04-06 DIAGNOSIS — Z79899 Other long term (current) drug therapy: Secondary | ICD-10-CM | POA: Diagnosis not present

## 2021-04-06 DIAGNOSIS — Z20822 Contact with and (suspected) exposure to covid-19: Secondary | ICD-10-CM | POA: Diagnosis not present

## 2021-04-06 DIAGNOSIS — J181 Lobar pneumonia, unspecified organism: Secondary | ICD-10-CM | POA: Diagnosis not present

## 2021-04-06 DIAGNOSIS — R42 Dizziness and giddiness: Secondary | ICD-10-CM | POA: Insufficient documentation

## 2021-04-06 DIAGNOSIS — R5383 Other fatigue: Secondary | ICD-10-CM | POA: Diagnosis present

## 2021-04-06 LAB — URINALYSIS, ROUTINE W REFLEX MICROSCOPIC
Bacteria, UA: NONE SEEN
Bilirubin Urine: NEGATIVE
Glucose, UA: NEGATIVE mg/dL
Hgb urine dipstick: NEGATIVE
Ketones, ur: NEGATIVE mg/dL
Leukocytes,Ua: NEGATIVE
Nitrite: NEGATIVE
Protein, ur: NEGATIVE mg/dL
Specific Gravity, Urine: 1.013 (ref 1.005–1.030)
pH: 6 (ref 5.0–8.0)

## 2021-04-06 LAB — CBC WITH DIFFERENTIAL/PLATELET
Abs Immature Granulocytes: 0.09 10*3/uL — ABNORMAL HIGH (ref 0.00–0.07)
Basophils Absolute: 0 10*3/uL (ref 0.0–0.1)
Basophils Relative: 0 %
Eosinophils Absolute: 0.1 10*3/uL (ref 0.0–0.5)
Eosinophils Relative: 1 %
HCT: 38.8 % (ref 36.0–46.0)
Hemoglobin: 12.4 g/dL (ref 12.0–15.0)
Immature Granulocytes: 1 %
Lymphocytes Relative: 14 %
Lymphs Abs: 1.5 10*3/uL (ref 0.7–4.0)
MCH: 30 pg (ref 26.0–34.0)
MCHC: 32 g/dL (ref 30.0–36.0)
MCV: 93.9 fL (ref 80.0–100.0)
Monocytes Absolute: 0.8 10*3/uL (ref 0.1–1.0)
Monocytes Relative: 7 %
Neutro Abs: 8.3 10*3/uL — ABNORMAL HIGH (ref 1.7–7.7)
Neutrophils Relative %: 77 %
Platelets: 234 10*3/uL (ref 150–400)
RBC: 4.13 MIL/uL (ref 3.87–5.11)
RDW: 12.5 % (ref 11.5–15.5)
WBC: 10.7 10*3/uL — ABNORMAL HIGH (ref 4.0–10.5)
nRBC: 0 % (ref 0.0–0.2)

## 2021-04-06 LAB — COMPREHENSIVE METABOLIC PANEL
ALT: 35 U/L (ref 0–44)
AST: 22 U/L (ref 15–41)
Albumin: 3.5 g/dL (ref 3.5–5.0)
Alkaline Phosphatase: 47 U/L (ref 38–126)
Anion gap: 8 (ref 5–15)
BUN: 37 mg/dL — ABNORMAL HIGH (ref 8–23)
CO2: 27 mmol/L (ref 22–32)
Calcium: 8.4 mg/dL — ABNORMAL LOW (ref 8.9–10.3)
Chloride: 101 mmol/L (ref 98–111)
Creatinine, Ser: 1.06 mg/dL — ABNORMAL HIGH (ref 0.44–1.00)
GFR, Estimated: 53 mL/min — ABNORMAL LOW (ref >60)
Glucose, Bld: 117 mg/dL — ABNORMAL HIGH (ref 70–99)
Potassium: 4.1 mmol/L (ref 3.5–5.1)
Sodium: 136 mmol/L (ref 135–145)
Total Bilirubin: 0.8 mg/dL (ref 0.3–1.2)
Total Protein: 6.6 g/dL (ref 6.5–8.1)

## 2021-04-06 LAB — CBG MONITORING, ED: Glucose-Capillary: 108 mg/dL — ABNORMAL HIGH (ref 70–99)

## 2021-04-06 LAB — TROPONIN I (HIGH SENSITIVITY): Troponin I (High Sensitivity): 3 ng/L (ref <18)

## 2021-04-06 LAB — RESP PANEL BY RT-PCR (FLU A&B, COVID) ARPGX2
Influenza A by PCR: NEGATIVE
Influenza B by PCR: NEGATIVE
SARS Coronavirus 2 by RT PCR: NEGATIVE

## 2021-04-06 MED ORDER — SODIUM CHLORIDE 0.9 % IV SOLN
1.0000 g | Freq: Once | INTRAVENOUS | Status: AC
  Administered 2021-04-06: 1 g via INTRAVENOUS
  Filled 2021-04-06: qty 10

## 2021-04-06 MED ORDER — DOXYCYCLINE HYCLATE 100 MG PO CAPS: 100.0000 mg | ORAL_CAPSULE | Freq: Two times a day (BID) | ORAL | 0 refills | Status: AC

## 2021-04-06 MED ORDER — SODIUM CHLORIDE 0.9 % IV BOLUS
1000.0000 mL | Freq: Once | INTRAVENOUS | Status: AC
  Administered 2021-04-06: 1000 mL via INTRAVENOUS

## 2021-04-06 NOTE — ED Notes (Signed)
Pt discharged and wheeled out of the ED in a wheel chair without difficulty. 

## 2021-04-06 NOTE — ED Triage Notes (Signed)
Pt presents to ED POV w/ son. Pt reports that she has had HA and dizziness for several days. Seen at [facility] and sent here. AAO x4.

## 2021-04-06 NOTE — ED Triage Notes (Signed)
Emergency Medicine Provider Triage Evaluation Note  Shannon Stephenson , a 80 y.o. female  was evaluated in triage.  Pt complains of headache.  Review of Systems  Positive: Dizziness, weakness, headache Negative: Fever, cough, neck pain, cold sxs, cp, sob, abd pain, dysuria, focal numbness or focal weakness  Physical Exam  BP 130/65 (BP Location: Left Arm)   Pulse 71   Temp 98.1 F (36.7 C) (Oral)   Resp 18   SpO2 100%  Gen:   Awake, no distress   HEENT:  Atraumatic, no nuchal rigidity Resp:  Normal effort  Cardiac:  Normal rate  Abd:   Nondistended, nontender  MSK:   Moves extremities without difficulty  Neuro:  Speech clear, no focal neuro deficit, normal gait  Medical Decision Making  Medically screening exam initiated at 6:14 PM.  Appropriate orders placed.  Shannon Stephenson was informed that the remainder of the evaluation will be completed by another provider, this initial triage assessment does not replace that evaluation, and the importance of remaining in the ED until their evaluation is complete.  Clinical Impression  Headache, dizziness, fatigue since yesterday.  No focal neuro deficit.  No cold sxs.    Shannon Helper, PA-C 04/06/21 1815

## 2021-04-06 NOTE — Discharge Instructions (Addendum)
You were diagnosed with pneumonia. You will need to take antibiotics for the next 7 days as prescribed.  Your next dose is due 04/07/21 in the morning.  Please drink more water at home.  You appear to be dehydrated today.  Drink enough water that your urine appears clear, and you are not feeling thirsty.  Schedule a follow up appointment with your doctor in 3 days.

## 2021-04-06 NOTE — ED Provider Notes (Signed)
[facility] EMERGENCY DEPARTMENT Provider Note   CSN: [id number] Arrival date & time: 04/06/21  1655     History Chief Complaint  Patient presents with  . Headache    Shannon Stephenson is a 80 y.o. female with a history of hypertension, diabetes, presented emergency department with generalized fatigue.  Patient's primarily [place] speaking, but her son at bedside is fluent in [place] and provides translation.  I offered a translator service but they refused.  Her son tells me that the patient woke up this morning feeling very fatigued.  She spent most of the day at church, but has had very low energy.  She is complained of a very mild diffuse headache.  She has not complained of any chest pain, difficulty breathing, cough, fevers, chills.  She has received 3 doses of the COVID-vaccine.  She reports that she has been very fatigued and felt like sleeping all day.  She has complained of some lightheadedness with standing.  There was seen at an urgent care and referred into the ED for further evaluation.  He states that she was in her normal state of health yesterday.  He denies any history of cardiac disease or MI.    HPI     Past Medical History:  Diagnosis Date  . Back pain   . Diabetes mellitus without complication (HCC)   . Hypertension     Patient Active Problem List   Diagnosis Date Noted  . Spinal stenosis of lumbar region 10/09/2013    Past Surgical History:  Procedure Laterality Date  . LUMBAR LAMINECTOMY/DECOMPRESSION MICRODISCECTOMY Bilateral 10/09/2013   Procedure: MICRO LUMBAR DECOMPRESSION L3-L5 (2 LEVELS);  Surgeon: Javier Docker, MD;  Location: [facility];  Service: Orthopedics;  Laterality: Bilateral;  . NO PAST SURGERIES       OB History   No obstetric history on file.     Family History  Problem Relation Age of Onset  . Breast cancer Neg Hx     Social History   Tobacco Use  . Smoking status: Never Smoker  . Smokeless tobacco:  Never Used  Substance Use Topics  . Alcohol use: No  . Drug use: No    Home Medications Prior to Admission medications   Medication Sig Start Date End Date Taking? Authorizing Provider  doxycycline (VIBRAMYCIN) 100 MG capsule Take 1 capsule (100 mg total) by mouth 2 (two) times daily for 7 days. 04/06/21 04/13/21 Yes Jannie Doyle, Kermit Balo, MD  acetaminophen (TYLENOL) 500 MG tablet Take 500 mg by mouth every 6 (six) hours as needed for pain.    [provider]  conjugated estrogens (PREMARIN) vaginal cream Place 1 Applicatorful vaginally daily. Patient not taking: Reported on 02/08/2017 01/24/17   Audry Pili, PA-C  hydrochlorothiazide (HYDRODIURIL) 25 MG tablet Take 25 mg by mouth every morning.     [provider]  lisinopril (PRINIVIL,ZESTRIL) 5 MG tablet Take 5 mg by mouth every morning.     [provider]  traMADol (ULTRAM) 50 MG tablet Take 1 tablet (50 mg total) by mouth every 6 (six) hours as needed for pain. 10/09/13   Jene Every, MD    Allergies    Patient has no known allergies.  Review of Systems   Review of Systems  Constitutional: Negative for chills and fever.  HENT: Negative for ear pain and sore throat.   Eyes: Negative for photophobia and visual disturbance.  Respiratory: Negative for cough and shortness of breath.   Cardiovascular: Negative for  chest pain and palpitations.  Gastrointestinal: Negative for abdominal pain, nausea and vomiting.  Genitourinary: Negative for dysuria and hematuria.  Musculoskeletal: Negative for arthralgias and back pain.  Skin: Negative for color change and rash.  Neurological: Positive for light-headedness and headaches. Negative for syncope.  All other systems reviewed and are negative.   Physical Exam Updated Vital Signs BP (!) 121/58   Pulse 62   Temp 98.1 F (36.7 C) (Oral)   Resp 16   SpO2 97%   Physical Exam Constitutional:      General: She is not in acute distress.    Comments: Appears tired   HENT:     Head: Normocephalic and atraumatic.  Eyes:     Conjunctiva/sclera: Conjunctivae normal.     Pupils: Pupils are equal, round, and reactive to light.  Cardiovascular:     Rate and Rhythm: Normal rate and regular rhythm.  Pulmonary:     Effort: Pulmonary effort is normal. No respiratory distress.  Abdominal:     General: There is no distension.     Tenderness: There is no abdominal tenderness.  Skin:    General: Skin is warm and dry.  Neurological:     General: No focal deficit present.     Mental Status: She is alert. Mental status is at baseline.     GCS: GCS eye subscore is 4. GCS verbal subscore is 5. GCS motor subscore is 6.     Cranial Nerves: No cranial nerve deficit or facial asymmetry.     Sensory: No sensory deficit.  Psychiatric:        Mood and Affect: Mood normal.        Behavior: Behavior normal.     ED Results / Procedures / Treatments   Labs (all labs ordered are listed, but only abnormal results are displayed) Labs Reviewed  COMPREHENSIVE METABOLIC PANEL - Abnormal; Notable for the following components:      Result Value   Glucose, Bld 117 (*)    BUN 37 (*)    Creatinine, Ser 1.06 (*)    Calcium 8.4 (*)    GFR, Estimated 53 (*)    All other components within normal limits  CBC WITH DIFFERENTIAL/PLATELET - Abnormal; Notable for the following components:   WBC 10.7 (*)    Neutro Abs 8.3 (*)    Abs Immature Granulocytes 0.09 (*)    All other components within normal limits  CBG MONITORING, ED - Abnormal; Notable for the following components:   Glucose-Capillary 108 (*)    All other components within normal limits  RESP PANEL BY RT-PCR (FLU A&B, COVID) [id number]  URINALYSIS, ROUTINE W REFLEX MICROSCOPIC  TROPONIN I (HIGH SENSITIVITY)    EKG EKG Interpretation  Date/Time:  Tuesday April 06 2021 18:20:48 EDT Ventricular Rate:  71 PR Interval:  180 QRS Duration: 72 QT Interval:  400 QTC Calculation: 434 R Axis:   2 Text  Interpretation: Normal sinus rhythm Low voltage QRS Cannot rule out Anterior infarct , age undetermined Abnormal ECG No STEMI Confirmed by Alvester Chou [phone]) on 04/06/2021 7:08:02 PM   Radiology CT Head Wo Contrast  Result Date: 04/06/2021 CLINICAL DATA:  New headaches. EXAM: CT HEAD WITHOUT CONTRAST TECHNIQUE: Contiguous axial images were obtained from the base of the skull through the vertex without intravenous contrast. COMPARISON:  None. FINDINGS: Brain: No evidence of acute infarction, hemorrhage, hydrocephalus, extra-axial collection or mass lesion/mass effect. Mild cerebral atrophy. Patchy low-attenuation changes in the deep white matter suggesting small vessel ischemia.  Vascular: Mild intracranial arterial vascular calcifications. Skull: The calvarium appears intact. Sinuses/Orbits: Mild mucosal thickening in the paranasal sinuses. No acute air-fluid levels. Mastoid air cells are clear. Other: None. IMPRESSION: 1. No acute intracranial abnormalities. 2. Mild cerebral atrophy and small vessel ischemic changes. Electronically Signed   By: Burman Nieves M.D.   On: 04/06/2021 19:29   DG Chest Portable 1 View  Result Date: 04/06/2021 CLINICAL DATA:  Several days of headache and dizziness. EXAM: PORTABLE CHEST 1 VIEW COMPARISON:  August 13, 2013 FINDINGS: The heart size and mediastinal contours are within normal limits. Aortic atherosclerosis. Diffuse coarse interstitial thickening. Hazy left basilar opacity. No significant pleural effusion or visible pneumothorax. The visualized skeletal structures are unremarkable. IMPRESSION: 1. Hazy left basilar opacity, which may reflect atelectasis or infiltrate. 2. 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 3. No pleural effusion or pneumothorax. Electronically Signed   By: Maudry Mayhew MD   On: 04/06/2021 20:07    Procedures Procedures   Medications Ordered in ED Medications  sodium chloride 0.9 % bolus  1,000 mL (1,000 mLs Intravenous New Bag/Given 04/06/21 2035)  cefTRIAXone (ROCEPHIN) 1 g in sodium chloride 0.9 % 100 mL IVPB (1 g Intravenous New Bag/Given 04/06/21 2037)    ED Course  I have reviewed the triage vital signs and the nursing notes.  Pertinent labs & imaging results that were available during my care of the patient were reviewed by me and considered in my medical decision making (see chart for details).  This patient complains of fatigue x 1 day.  This involves an extensive number of treatment options, and is a complaint that carries with it a high risk of complications and morbidity.  The differential diagnosis includes anemia vs atypical ACS vs viral infection (including covid-19) vs dehydration vs other  BS normal on arrival. [facility] ordered and reviewed - no acute findings.  No signs or symptoms of meningitis.  Low suspicion for stroke/CVA.  I ordered, reviewed, and interpreted labs.  No life-threatening abnormalities were noted on these tests.  Some mild increase in Bun and Cr over baseline, does not meet AKI criteria.  Her son reports she does not drink enough water at home - suspect this is pre-renal related to poor fluid intake.  Trop 3, ECG reassuring, doubt ACS.  Doubt PE. Hgb normal - no aacute anemia.  I ordered imaging studies which included dg chest I independently visualized and interpreted imaging which possible LLL infiltrate, and the monitor tracing which showed NSR Additional history was obtained from patient's son at bedside  IV fluids 1 L and IV rocephin ordered for CAP and mild dehydration.   Covid/flu negative.  Her son prefers she goes home.  She is not hypoxic, no other signs of sepsis.  I think this is reasonable.  We'll start on doxycycline for 7 days, encouraged good PO intake.   Clinical Course as of 04/06/21 2155  Tue Apr 06, 2021  2027 With small infiltrate loaded in the left lung field, as well as her white blood cell count of 10.7, have  opted to give her some IV fluids and IV Rocephin to treat for community-acquired pneumonia.  I updated her son.  After treatment, anticipates she is well enough for discharge home on p.o. antibiotics.  They verbalized agreement [MT]    Clinical Course User Index [MT] Hildred Mollica, Kermit Balo, MD   Final Clinical Impression(s) / ED Diagnoses Final diagnoses:  Community acquired pneumonia of left lower lobe of lung  Rx / DC Orders ED Discharge Orders         Ordered    doxycycline (VIBRAMYCIN) 100 MG capsule  2 times daily        04/06/21 2150           Terald Sleeperrifan, Sidrah Harden J, MD 04/06/21 2155

## 2021-09-07 ENCOUNTER — Other Ambulatory Visit: Payer: Self-pay

## 2021-09-07 ENCOUNTER — Emergency Department (HOSPITAL_COMMUNITY): Payer: Medicare Other

## 2021-09-07 ENCOUNTER — Inpatient Hospital Stay (HOSPITAL_COMMUNITY)
Admission: EM | Admit: 2021-09-07 | Discharge: 2021-09-13 | DRG: 157 | Disposition: A | Discharge status: Home / Self Care | Payer: Medicare Other | Source: Ambulatory Visit | Attending: Student | Admitting: Student

## 2021-09-07 DIAGNOSIS — U071 COVID-19: Secondary | ICD-10-CM | POA: Diagnosis present

## 2021-09-07 DIAGNOSIS — D6489 Other specified anemias: Secondary | ICD-10-CM | POA: Diagnosis not present

## 2021-09-07 DIAGNOSIS — T380X5A Adverse effect of glucocorticoids and synthetic analogues, initial encounter: Secondary | ICD-10-CM | POA: Diagnosis not present

## 2021-09-07 DIAGNOSIS — E1165 Type 2 diabetes mellitus with hyperglycemia: Secondary | ICD-10-CM | POA: Diagnosis not present

## 2021-09-07 DIAGNOSIS — J988 Other specified respiratory disorders: Secondary | ICD-10-CM | POA: Diagnosis not present

## 2021-09-07 DIAGNOSIS — Z833 Family history of diabetes mellitus: Secondary | ICD-10-CM | POA: Diagnosis not present

## 2021-09-07 DIAGNOSIS — Z79899 Other long term (current) drug therapy: Secondary | ICD-10-CM

## 2021-09-07 DIAGNOSIS — I10 Essential (primary) hypertension: Secondary | ICD-10-CM

## 2021-09-07 DIAGNOSIS — K122 Cellulitis and abscess of mouth: Secondary | ICD-10-CM | POA: Diagnosis present

## 2021-09-07 DIAGNOSIS — Z7983 Long term (current) use of bisphosphonates: Secondary | ICD-10-CM

## 2021-09-07 DIAGNOSIS — L0201 Cutaneous abscess of face: Secondary | ICD-10-CM | POA: Diagnosis present

## 2021-09-07 DIAGNOSIS — Z978 Presence of other specified devices: Secondary | ICD-10-CM | POA: Diagnosis not present

## 2021-09-07 DIAGNOSIS — Z1624 Resistance to multiple antibiotics: Secondary | ICD-10-CM | POA: Diagnosis not present

## 2021-09-07 DIAGNOSIS — D72829 Elevated white blood cell count, unspecified: Secondary | ICD-10-CM | POA: Diagnosis not present

## 2021-09-07 DIAGNOSIS — J9601 Acute respiratory failure with hypoxia: Secondary | ICD-10-CM

## 2021-09-07 DIAGNOSIS — R0789 Other chest pain: Secondary | ICD-10-CM | POA: Diagnosis present

## 2021-09-07 DIAGNOSIS — J384 Edema of larynx: Secondary | ICD-10-CM | POA: Diagnosis present

## 2021-09-07 DIAGNOSIS — D72825 Bandemia: Secondary | ICD-10-CM | POA: Diagnosis not present

## 2021-09-07 DIAGNOSIS — B954 Other streptococcus as the cause of diseases classified elsewhere: Secondary | ICD-10-CM | POA: Diagnosis present

## 2021-09-07 DIAGNOSIS — M810 Age-related osteoporosis without current pathological fracture: Secondary | ICD-10-CM | POA: Diagnosis present

## 2021-09-07 DIAGNOSIS — E119 Type 2 diabetes mellitus without complications: Secondary | ICD-10-CM | POA: Diagnosis not present

## 2021-09-07 LAB — CBC WITH DIFFERENTIAL/PLATELET
Abs Immature Granulocytes: 0.17 10*3/uL — ABNORMAL HIGH (ref 0.00–0.07)
Basophils Absolute: 0 10*3/uL (ref 0.0–0.1)
Basophils Relative: 0 %
Eosinophils Absolute: 0.1 10*3/uL (ref 0.0–0.5)
Eosinophils Relative: 0 %
HCT: 38.3 % (ref 36.0–46.0)
Hemoglobin: 12.2 g/dL (ref 12.0–15.0)
Immature Granulocytes: 1 %
Lymphocytes Relative: 3 %
Lymphs Abs: 0.6 10*3/uL — ABNORMAL LOW (ref 0.7–4.0)
MCH: 30.3 pg (ref 26.0–34.0)
MCHC: 31.9 g/dL (ref 30.0–36.0)
MCV: 95.3 fL (ref 80.0–100.0)
Monocytes Absolute: 0.3 10*3/uL (ref 0.1–1.0)
Monocytes Relative: 2 %
Neutro Abs: 16.5 10*3/uL — ABNORMAL HIGH (ref 1.7–7.7)
Neutrophils Relative %: 94 %
Platelets: 210 10*3/uL (ref 150–400)
RBC: 4.02 MIL/uL (ref 3.87–5.11)
RDW: 12.4 % (ref 11.5–15.5)
WBC: 17.7 10*3/uL — ABNORMAL HIGH (ref 4.0–10.5)
nRBC: 0 % (ref 0.0–0.2)

## 2021-09-07 LAB — BASIC METABOLIC PANEL
Anion gap: 10 (ref 5–15)
BUN: 17 mg/dL (ref 8–23)
CO2: 22 mmol/L (ref 22–32)
Calcium: 8.6 mg/dL — ABNORMAL LOW (ref 8.9–10.3)
Chloride: 104 mmol/L (ref 98–111)
Creatinine, Ser: 0.92 mg/dL (ref 0.44–1.00)
GFR, Estimated: >60 mL/min (ref >60)
Glucose, Bld: 226 mg/dL — ABNORMAL HIGH (ref 70–99)
Potassium: 3.8 mmol/L (ref 3.5–5.1)
Sodium: 136 mmol/L (ref 135–145)

## 2021-09-07 MED ORDER — ENOXAPARIN SODIUM 40 MG/0.4ML IJ SOSY: 40.0000 mg | PREFILLED_SYRINGE | Freq: Every day | INTRAMUSCULAR | Status: DC

## 2021-09-07 MED ORDER — METHYLPREDNISOLONE SODIUM SUCC 125 MG IJ SOLR
125.0000 mg | Freq: Once | INTRAMUSCULAR | Status: AC
  Administered 2021-09-07: 125 mg via INTRAVENOUS
  Filled 2021-09-07: qty 2

## 2021-09-07 MED ORDER — DIPHENHYDRAMINE HCL 50 MG/ML IJ SOLN
25.0000 mg | Freq: Once | INTRAMUSCULAR | Status: AC
  Administered 2021-09-07: 25 mg via INTRAVENOUS
  Filled 2021-09-07: qty 1

## 2021-09-07 MED ORDER — FAMOTIDINE 20 MG IN NS 100 ML IVPB
20.0000 mg | Freq: Once | INTRAVENOUS | Status: AC
  Administered 2021-09-07: 20 mg via INTRAVENOUS
  Filled 2021-09-07: qty 100

## 2021-09-07 MED ORDER — ACETAMINOPHEN 650 MG RE SUPP: 650.0000 mg | Freq: Four times a day (QID) | RECTAL | Status: DC | PRN

## 2021-09-07 MED ORDER — HYDROMORPHONE HCL 1 MG/ML IJ SOLN
0.2500 mg | INTRAMUSCULAR | Status: DC | PRN
Start: 2021-09-07 — End: 2021-09-08

## 2021-09-07 MED ORDER — IOHEXOL 350 MG/ML SOLN
75.0000 mL | Freq: Once | INTRAVENOUS | Status: AC | PRN
  Administered 2021-09-07: 75 mL via INTRAVENOUS

## 2021-09-07 MED ORDER — SODIUM CHLORIDE 0.9 % IV SOLN
3.0000 g | Freq: Once | INTRAVENOUS | Status: AC
  Administered 2021-09-07: 3 g via INTRAVENOUS
  Filled 2021-09-07: qty 8

## 2021-09-07 MED ORDER — ACETAMINOPHEN 325 MG PO TABS
650.0000 mg | ORAL_TABLET | Freq: Four times a day (QID) | ORAL | Status: DC | PRN
  Administered 2021-09-08: 650 mg via ORAL
  Filled 2021-09-07: qty 2

## 2021-09-07 MED ORDER — HYDROMORPHONE HCL 1 MG/ML IJ SOLN: 0.5000 mg | INTRAMUSCULAR | Status: DC | PRN

## 2021-09-07 NOTE — H&P (Signed)
Date: 09/08/2021               Patient Name:  Shannon Stephenson MRN: [id number]  DOB: [birth year] Age / Sex: 80 y.o., female   PCP: Macy Mis, MD         Medical Service: Internal Medicine Teaching Service         Attending Physician: Dr. Inez Catalina, MD    First Contact: Ellison Carwin, MD Pager: EZ [phone]  Second Contact: Marolyn Haller, MD Pager: [phone]       After Hours (After 5p/  First Contact Pager: [phone]  weekends / holidays): Second Contact Pager: [phone]   SUBJECTIVE   Chief Complaint: Tongue swelling  History of Present Illness:   Shannon Stephenson is a 14 y.o. F with a PMH of diet controlled T2DM, HTN, and spinal stenosis s/p lumbar laminectomy in 2014 who is presenting with tongue swelling.  Son at bedside to provide history  He states that patient was having tooth pain this week but was unable to go in to the dentist over the weekend. Was able to see the dentist yesterday, where they were able to remove a broken tooth and they discharged her with some antibiotics but he doesn't remember what kind. Later that night, she began having pain, worsening swelling, and difficulty breathing. At that time the dentist recommended bringing her to ED. Initially concerned for possible allergic reaction from antibiotic post dental manipulation vs angioedema due to lisinopril. She has never had an oral infection before and this is her first time getting a tooth pulled. She has not had fevers or chills.  Since coming to the ED she has still had trouble swallowing. Was able to have some soup earlier, but with much difficulty. Has choked when drinking water. States her breathing has become a little better since coming to the ED and getting the benadryl and steroids.    ED Course: Patient initially received steroids for presumed angioedema but a CT soft tissue neck w/ contrast was obtained which identified a large abscess of the sublingual space with extension in to the left  submandibular space. Oral surgery to be consulted in the AM.   Meds:  No outpatient medications have been marked as taking for the 09/07/21 encounter [facility] Encounter).    Past Medical History:  Diagnosis Date   Back pain    Diabetes mellitus without complication (HCC)    Hypertension     Past Surgical History:  Procedure Laterality Date   LUMBAR LAMINECTOMY/DECOMPRESSION MICRODISCECTOMY Bilateral 10/09/2013   Procedure: MICRO LUMBAR DECOMPRESSION L3-L5 (2 LEVELS);  Surgeon: Javier Docker, MD;  Location: [facility];  Service: Orthopedics;  Laterality: Bilateral;   NO PAST SURGERIES      Social:  Lives With family, is retired, does not have any substance use  Family History:  History of diabetes in her son  Allergies: Allergies as of 09/07/2021   (No Known Allergies)    Review of Systems: A complete ROS was negative except as per HPI.   OBJECTIVE:   Physical Exam: Blood pressure (!) 178/75, pulse 80, temperature 99.5 F (37.5 C), temperature source Oral, resp. rate 16, SpO2 97 %.  Constitutional: elderly woman sleeping in bed, in no acute distress HENT: normocephalic atraumatic, submandibular erythema and swelling, warm to touch, tooth pulled on the left, more tonsillar swelling present on the left, swollen tongue Eyes: conjunctiva non-erythematous Neck: supple Cardiovascular: regular rate and rhythm, no m/r/g Pulmonary/Chest: normal work of breathing on room  air, lungs clear to auscultation bilaterally Abdominal: soft, non-tender, distended MSK: normal bulk and tone Neurological: follows commands, arousable to voice Skin: warm and dry Psych: normal affect  Labs: CBC    Component Value Date/Time   WBC 17.7 (H) 09/07/2021 1950   RBC 4.02 09/07/2021 1950   HGB 12.2 09/07/2021 1950   HCT 38.3 09/07/2021 1950   PLT 210 09/07/2021 1950   MCV 95.3 09/07/2021 1950   MCH 30.3 09/07/2021 1950   MCHC 31.9 09/07/2021 1950   RDW 12.4 09/07/2021 1950   LYMPHSABS 0.6  (L) 09/07/2021 1950   MONOABS 0.3 09/07/2021 1950   EOSABS 0.1 09/07/2021 1950   BASOSABS 0.0 09/07/2021 1950     CMP     Component Value Date/Time   NA 136 09/07/2021 1950   K 3.8 09/07/2021 1950   CL 104 09/07/2021 1950   CO2 22 09/07/2021 1950   GLUCOSE 226 (H) 09/07/2021 1950   BUN 17 09/07/2021 1950   CREATININE 0.92 09/07/2021 1950   CALCIUM 8.6 (L) 09/07/2021 1950   PROT 6.6 04/06/2021 1813   ALBUMIN 3.5 04/06/2021 1813   AST 22 04/06/2021 1813   ALT 35 04/06/2021 1813   ALKPHOS 47 04/06/2021 1813   BILITOT 0.8 04/06/2021 1813   GFRNONAA >60 09/07/2021 1950   GFRAA 74 (L) 10/10/2013 0448    Imaging: CT Soft Tissue Neck W Contrast  Result Date: 09/07/2021 CLINICAL DATA:  Throat swelling and dental disease. EXAM: CT NECK WITH CONTRAST TECHNIQUE: Multidetector CT imaging of the neck was performed using the standard protocol following the bolus administration of intravenous contrast. CONTRAST:  84mL OMNIPAQUE IOHEXOL 350 MG/ML SOLN COMPARISON:  None. FINDINGS: Pharynx and larynx: There is a large abscess of sublingual space that measures 4.4 x 1.3 cm. There is multifocal internal gas. There is a component of the abscess extending into the left submandibular space. The retropharyngeal space is clear. The nasopharynx is normal. There is no airway stenosis. Normal epiglottis. Salivary glands: Negative Thyroid: Normal Lymph nodes: None enlarged or abnormal density. Vascular: Negative. Limited intracranial: Negative. Visualized orbits: Negative. Mastoids and visualized paranasal sinuses: Clear. Skeleton: No acute or aggressive process. Upper chest: Negative. Other: None. IMPRESSION: 1. Large abscess of the sublingual space with extension into the left submandibular space. 2. No airway stenosis. Electronically Signed   By: Deatra Robinson M.D.   On: 09/07/2021 22:36    EKG: personally reviewed my interpretation is sinus rhythm, normal axis, normal interval, t wave inversions in lead V1  unchanged from prior   ASSESSMENT & PLAN:    Assessment & Plan by Problem: Principal Problem:   Submandibular abscess Active Problems:   T2DM (type 2 diabetes mellitus) (HCC)   HTN (hypertension)   [name] is a 80 y.o. F with a PMH of T2DM, HTN, spinal stenosis s/p lumbar laminectomy in 2014 who is presenting with tongue swelling found to have a submandibular abscess.   #Submandibular Abscess Patient presented with oral swelling initially concerning for angioedema. Patient received IV solumedrol, but CT scan showed submandibular abscess so patient was stated on unasyn. Does have a leukocytosis to 17. Patient is hemodynamically stable and able to maintain her airway. Oral surgery is not in house overnight, will continue treatment with antibiotics and consult in the AM. Will keep patient NPO due to concern of her choking while trying to eat/drink earlier.  - oral surgery consult, appreciate assistance - continue unasyn - NPO  - Bcx pending - 0.25 dilaudid Q2 PRN  #  Asymptomatic COVID-19 infection Incidentally found on admission, patient not having symptoms, saturating well on room air. May complicate abscess drainage. No intervention required at this time.   #Hypertension Patient takes lisinopril at home, will continue now that we are no longer suspicious for angioedema once patient is able to resume oral meds. May also be on HCTZ but son is unsure of doses. Clarify medications in the AM - holding lisinopril and HCTZ  #Diet controlled DMII Patient has a history of diabetes per chart review but all recent A1cs are within the normal range. Sugars slightly elevated in the setting of solumedrol dose. Do not suspect patient will require insulin coverage at this time. - last A1c 5.4 in April.  Diet: NPO VTE: None 2/2 surgery IVF: None,None Code: Full  Prior to Admission Living Arrangement: Home, living with family Anticipated Discharge Location: Home Barriers to Discharge:  Continued medical treatment of abscess  Dispo: Admit patient to Inpatient with expected length of stay greater than 2 midnights.  Signed: Ilene Qua, MD Internal Medicine Resident PGY-1 Pager: [phone]  09/08/2021, 12:08 AM

## 2021-09-07 NOTE — ED Notes (Signed)
Patient currently at CT scan .  

## 2021-09-07 NOTE — ED Provider Notes (Signed)
Emergency Medicine Provider Triage Evaluation Note  KAJAL SCALICI , a 80 y.o. female  was evaluated in triage.  Pt complains of angioedema.  Per patient's family member, patient recently had a tooth pulled yesterday afternoon.  She was prescribed antibiotic.  Is unknown what the antibiotic was however she developed swelling of her throat tongue shortly after taking it.  She was seen by urgent care before she came to the emergency room and they gave her 10 mg of Decadron IM prior to sending her to the emergency department for airway management.    Review of Systems  Positive: Difficulty breathing, angioedema, swollen tongue, swelling of neck Negative: Chest pain, stridor  Physical Exam  BP (!) 153/64   Pulse 88   Temp 99.5 F (37.5 C) (Oral)   Resp 16   SpO2 95%  Gen:   Awake, appears to be struggling with breathing Resp:  Normal effort.  Lung sounds with no stridor but does have some rhonchi and difficulty moving air. MSK:   Moves extremities without difficulty Other:  Tongue is swollen.  Unable to visualize posterior pharynx.  Swelling of bilateral neck.  Charge RN notified of this and that patient needs a bed soon as possible. She is oxygenating well on room air.  She is still moving air but does have some difficulty.  I am concerned for her airway. Prescribed 125 of Solu-Medrol IV. Will continue to monitor.  Medical Decision Making  Medically screening exam initiated at 6:17 PM.  Appropriate orders placed.  Ercie T Shimamoto was informed that the remainder of the evaluation will be completed by another provider, this initial triage assessment does not replace that evaluation, and the importance of remaining in the ED until their evaluation is complete.   Claudie Leach, PA-C 09/07/21 1820    Virgina Norfolk, DO 09/07/21 2152

## 2021-09-07 NOTE — ED Triage Notes (Signed)
Pt and family sent here from [facility] for eval of throat swelling after starting new  abx and pain medication yesterday for cracked teeth. Given 10 mg solumedrol IM at 1440 today d/t swollen tongue and throat. Pt reports difficulty swallowing. Tongue still significantly swollen in triage.

## 2021-09-07 NOTE — ED Provider Notes (Signed)
[facility] Provider Note  CSN: [id number] Arrival date & time: 09/07/21 1519    History Chief Complaint  Patient presents with   Angioedema   History provided by Son who speaks english but also had a video interpreter if needed.  Shannon Stephenson is a 80 y.o. female brought to the ED by son for evaluation of tongue swelling. She was having a toothache yesterday so he took her to a dentist who pulled the tooth that was bothering her and put her on some antibiotics he does not know the name of. During the night she began having tongue swelling so he took her UC and then was sent here after getting IM Decadron. Some improvement in the meantime. She is having some discomfort with swallowing but breathing is OK. No fevers.    Past Medical History:  Diagnosis Date   Back pain    Diabetes mellitus without complication (HCC)    Hypertension     Past Surgical History:  Procedure Laterality Date   LUMBAR LAMINECTOMY/DECOMPRESSION MICRODISCECTOMY Bilateral 10/09/2013   Procedure: MICRO LUMBAR DECOMPRESSION L3-L5 (2 LEVELS);  Surgeon: Javier Docker, MD;  Location: [facility];  Service: Orthopedics;  Laterality: Bilateral;   NO PAST SURGERIES      Family History  Problem Relation Age of Onset   Breast cancer Neg Hx     Social History   Tobacco Use   Smoking status: Never   Smokeless tobacco: Never  Substance Use Topics   Alcohol use: No   Drug use: No     Home Medications Prior to Admission medications   Medication Sig Start Date End Date Taking? Authorizing Provider  acetaminophen (TYLENOL) 500 MG tablet Take 500 mg by mouth every 6 (six) hours as needed for pain.    [provider]  conjugated estrogens (PREMARIN) vaginal cream Place 1 Applicatorful vaginally daily. Patient not taking: Reported on 02/08/2017 01/24/17   Audry Pili, PA-C  hydrochlorothiazide (HYDRODIURIL) 25 MG tablet Take 25 mg by mouth every morning.     [provider]   lisinopril (PRINIVIL,ZESTRIL) 5 MG tablet Take 5 mg by mouth every morning.     [provider]  traMADol (ULTRAM) 50 MG tablet Take 1 tablet (50 mg total) by mouth every 6 (six) hours as needed for pain. 10/09/13   Jene Every, MD     Allergies    Patient has no known allergies.   Review of Systems   Review of Systems A comprehensive review of systems was completed and negative except as noted in HPI.    Physical Exam BP (!) 178/75   Pulse 80   Temp 99.5 F (37.5 C) (Oral)   Resp 16   SpO2 97%   Physical Exam Vitals and nursing note reviewed.  Constitutional:      Appearance: Normal appearance.  HENT:     Head: Normocephalic and atraumatic.     Nose: Nose normal.     Mouth/Throat:     Mouth: Mucous membranes are moist.     Comments: Moderate angioedema of tongue and sublingual spaces, there is tightness in the submandibular region with some surrounding erythema Eyes:     Extraocular Movements: Extraocular movements intact.     Conjunctiva/sclera: Conjunctivae normal.  Cardiovascular:     Rate and Rhythm: Normal rate.  Pulmonary:     Effort: Pulmonary effort is normal.     Breath sounds: Normal breath sounds. No stridor. No wheezing or rhonchi.  Abdominal:  General: Abdomen is flat.     Palpations: Abdomen is soft.     Tenderness: There is no abdominal tenderness.  Musculoskeletal:        General: No swelling. Normal range of motion.     Cervical back: Neck supple.  Skin:    General: Skin is warm and dry.  Neurological:     General: No focal deficit present.     Mental Status: She is alert.  Psychiatric:        Mood and Affect: Mood normal.     ED Results / Procedures / Treatments   Labs (all labs ordered are listed, but only abnormal results are displayed) Labs Reviewed  BASIC METABOLIC PANEL - Abnormal; Notable for the following components:      Result Value   Glucose, Bld 226 (*)    Calcium 8.6 (*)    All other components within  normal limits  CBC WITH DIFFERENTIAL/PLATELET - Abnormal; Notable for the following components:   WBC 17.7 (*)    Neutro Abs 16.5 (*)    Lymphs Abs 0.6 (*)    Abs Immature Granulocytes 0.17 (*)    All other components within normal limits  CULTURE, BLOOD (ROUTINE X 2)  CULTURE, BLOOD (ROUTINE X 2)  RESP PANEL BY RT-PCR (FLU A&B, COVID) [id number]    EKG EKG Interpretation  Date/Time:  Tuesday September 07 2021 18:40:22 EDT Ventricular Rate:  87 PR Interval:  146 QRS Duration: 72 QT Interval:  369 QTC Calculation: 444 R Axis:   25 Text Interpretation: Sinus rhythm Abnormal R-wave progression, early transition No significant change since last tracing Confirmed by Susy Frizzle [phone]) on 09/07/2021 6:45:54 PM  Radiology CT Soft Tissue Neck W Contrast  Result Date: 09/07/2021 CLINICAL DATA:  Throat swelling and dental disease. EXAM: CT NECK WITH CONTRAST TECHNIQUE: Multidetector CT imaging of the neck was performed using the standard protocol following the bolus administration of intravenous contrast. CONTRAST:  76mL OMNIPAQUE IOHEXOL 350 MG/ML SOLN COMPARISON:  None. FINDINGS: Pharynx and larynx: There is a large abscess of sublingual space that measures 4.4 x 1.3 cm. There is multifocal internal gas. There is a component of the abscess extending into the left submandibular space. The retropharyngeal space is clear. The nasopharynx is normal. There is no airway stenosis. Normal epiglottis. Salivary glands: Negative Thyroid: Normal Lymph nodes: None enlarged or abnormal density. Vascular: Negative. Limited intracranial: Negative. Visualized orbits: Negative. Mastoids and visualized paranasal sinuses: Clear. Skeleton: No acute or aggressive process. Upper chest: Negative. Other: None. IMPRESSION: 1. Large abscess of the sublingual space with extension into the left submandibular space. 2. No airway stenosis. Electronically Signed   By: Deatra Robinson M.D.   On: 09/07/2021 22:36     Procedures .Critical Care Performed by: Pollyann Savoy, MD Authorized by: Pollyann Savoy, MD   Critical care provider statement:    Critical care time (minutes):  45   Critical care time was exclusive of:  Separately billable procedures and treating other patients   Critical care was necessary to treat or prevent imminent or life-threatening deterioration of the following conditions: Airway.   Critical care was time spent personally by me on the following activities:  Discussions with consultants, evaluation of patient's response to treatment, examination of patient, ordering and performing treatments and interventions, ordering and review of laboratory studies, ordering and review of radiographic studies, pulse oximetry, re-evaluation of patient's condition, obtaining history from patient or surrogate and review of old charts   Care discussed  with: admitting provider    Medications Ordered in the ED Medications  Ampicillin-Sulbactam (UNASYN) 3 g in sodium chloride 0.9 % 100 mL IVPB (3 g Intravenous New Bag/Given 09/07/21 2250)  methylPREDNISolone sodium succinate (SOLU-MEDROL) 125 mg/2 mL injection 125 mg (125 mg Intravenous Given 09/07/21 1846)  diphenhydrAMINE (BENADRYL) injection 25 mg (25 mg Intravenous Given 09/07/21 1915)  famotidine (PEPCID) IVPB 20 mg in NS 100 mL IVPB (0 mg Intravenous Stopped 09/07/21 1946)  iohexol (OMNIPAQUE) 350 MG/ML injection 75 mL (75 mLs Intravenous Contrast Given 09/07/21 2217)     MDM Rules/Calculators/A&P MDM Patient with angioedema vs ludwig's. Could be new Abx or more likely Lisinopril if it is angioedema. Will check labs, send for CT. She has already been given decadron at [facility] and solumedrol from Triage provider here. Will add benadryl and pepcid. Continue to monitor closely, airway is currently patent.   ED Course  I have reviewed the triage vital signs and the nursing notes.  Pertinent labs & imaging results that were available during my  care of the patient were reviewed by me and considered in my medical decision making (see chart for details).  Clinical Course as of 09/07/21 2314  Tue Sep 07, 2021  2021 CBC with elevated WBC.   [CS]  2056 BMP with mild hyperglycemia, otherwise neg.  [CS]  2203 Awaiting CT, swelling is about the same. Given delay in getting confirmatory imaging and elevated WBC, will give a dose of Abx pending results. Blood cultures also ordered.  [CS]  2240 CT images and results reviewed. Large abscess in submandibular area. Her airway remains patent. Will discuss with ENT.  [CS]  2311 Spoke with Dr. Suszanne Conners who advises this needs to be managed by Oral Surgery. The oral surgeon will be back on call in the AM. Spoke with Dr. Austin Miles, IM resident who will evaluate for admission.  [CS]    Clinical Course User Index [CS] Pollyann Savoy, MD    Final Clinical Impression(s) / ED Diagnoses Final diagnoses:  Ludwig's angina    Rx / DC Orders ED Discharge Orders     None        Pollyann Savoy, MD 09/07/21 2314

## 2021-09-08 ENCOUNTER — Inpatient Hospital Stay (HOSPITAL_COMMUNITY): Payer: Medicare Other

## 2021-09-08 ENCOUNTER — Inpatient Hospital Stay (HOSPITAL_COMMUNITY): Payer: Medicare Other | Admitting: Anesthesiology

## 2021-09-08 ENCOUNTER — Encounter (HOSPITAL_COMMUNITY)
Admission: EM | Disposition: A | Discharge status: Home / Self Care | Payer: Self-pay | Source: Ambulatory Visit | Attending: Oral Surgery

## 2021-09-08 ENCOUNTER — Encounter (HOSPITAL_COMMUNITY): Payer: Self-pay | Admitting: Internal Medicine

## 2021-09-08 DIAGNOSIS — I10 Essential (primary) hypertension: Secondary | ICD-10-CM | POA: Diagnosis not present

## 2021-09-08 DIAGNOSIS — E119 Type 2 diabetes mellitus without complications: Secondary | ICD-10-CM

## 2021-09-08 DIAGNOSIS — J9601 Acute respiratory failure with hypoxia: Secondary | ICD-10-CM

## 2021-09-08 DIAGNOSIS — K122 Cellulitis and abscess of mouth: Secondary | ICD-10-CM | POA: Diagnosis present

## 2021-09-08 DIAGNOSIS — Z978 Presence of other specified devices: Secondary | ICD-10-CM | POA: Diagnosis not present

## 2021-09-08 HISTORY — PX: INCISION AND DRAINAGE ABSCESS: SHX5864

## 2021-09-08 LAB — POCT I-STAT 7, (LYTES, BLD GAS, ICA,H+H)
Acid-Base Excess: 2 mmol/L (ref 0.0–2.0)
Bicarbonate: 26.3 mmol/L (ref 20.0–28.0)
Calcium, Ion: 1.12 mmol/L — ABNORMAL LOW (ref 1.15–1.40)
HCT: 32 % — ABNORMAL LOW (ref 36.0–46.0)
Hemoglobin: 10.9 g/dL — ABNORMAL LOW (ref 12.0–15.0)
O2 Saturation: 99 %
Patient temperature: 98.2
Potassium: 3.6 mmol/L (ref 3.5–5.1)
Sodium: 140 mmol/L (ref 135–145)
TCO2: 27 mmol/L (ref 22–32)
pCO2 arterial: 40.3 mmHg (ref 32.0–48.0)
pH, Arterial: 7.422 (ref 7.350–7.450)
pO2, Arterial: 124 mmHg — ABNORMAL HIGH (ref 83.0–108.0)

## 2021-09-08 LAB — COMPREHENSIVE METABOLIC PANEL
ALT: 58 U/L — ABNORMAL HIGH (ref 0–44)
AST: 51 U/L — ABNORMAL HIGH (ref 15–41)
Albumin: 2.6 g/dL — ABNORMAL LOW (ref 3.5–5.0)
Alkaline Phosphatase: 102 U/L (ref 38–126)
Anion gap: 8 (ref 5–15)
BUN: 17 mg/dL (ref 8–23)
CO2: 25 mmol/L (ref 22–32)
Calcium: 8.7 mg/dL — ABNORMAL LOW (ref 8.9–10.3)
Chloride: 104 mmol/L (ref 98–111)
Creatinine, Ser: 0.82 mg/dL (ref 0.44–1.00)
GFR, Estimated: >60 mL/min (ref >60)
Glucose, Bld: 195 mg/dL — ABNORMAL HIGH (ref 70–99)
Potassium: 4.6 mmol/L (ref 3.5–5.1)
Sodium: 137 mmol/L (ref 135–145)
Total Bilirubin: 0.8 mg/dL (ref 0.3–1.2)
Total Protein: 6.5 g/dL (ref 6.5–8.1)

## 2021-09-08 LAB — GLUCOSE, CAPILLARY
Glucose-Capillary: 227 mg/dL — ABNORMAL HIGH (ref 70–99)
Glucose-Capillary: 238 mg/dL — ABNORMAL HIGH (ref 70–99)

## 2021-09-08 LAB — CBC
HCT: 39 % (ref 36.0–46.0)
Hemoglobin: 12.4 g/dL (ref 12.0–15.0)
MCH: 30.6 pg (ref 26.0–34.0)
MCHC: 31.8 g/dL (ref 30.0–36.0)
MCV: 96.3 fL (ref 80.0–100.0)
Platelets: 215 10*3/uL (ref 150–400)
RBC: 4.05 MIL/uL (ref 3.87–5.11)
RDW: 12.4 % (ref 11.5–15.5)
WBC: 19.1 10*3/uL — ABNORMAL HIGH (ref 4.0–10.5)
nRBC: 0 % (ref 0.0–0.2)

## 2021-09-08 LAB — RESP PANEL BY RT-PCR (FLU A&B, COVID) ARPGX2
Influenza A by PCR: NEGATIVE
Influenza B by PCR: NEGATIVE
SARS Coronavirus 2 by RT PCR: POSITIVE — AB

## 2021-09-08 LAB — MRSA NEXT GEN BY PCR, NASAL: MRSA by PCR Next Gen: NOT DETECTED

## 2021-09-08 SURGERY — INCISION AND DRAINAGE, ABSCESS
Anesthesia: General

## 2021-09-08 MED ORDER — HYDRALAZINE HCL 20 MG/ML IJ SOLN
10.0000 mg | INTRAMUSCULAR | Status: DC | PRN
Start: 2021-09-08 — End: 2021-09-12
  Administered 2021-09-11: 20 mg via INTRAVENOUS
  Filled 2021-09-08: qty 2
  Filled 2021-09-08: qty 1

## 2021-09-08 MED ORDER — PROPOFOL 1000 MG/100ML IV EMUL
5.0000 ug/kg/min | INTRAVENOUS | Status: DC
  Administered 2021-09-08 – 2021-09-09 (×2): 50 ug/kg/min via INTRAVENOUS
  Administered 2021-09-09: 45 ug/kg/min via INTRAVENOUS
  Administered 2021-09-09: 40 ug/kg/min via INTRAVENOUS
  Administered 2021-09-09 – 2021-09-10 (×2): 50 ug/kg/min via INTRAVENOUS
  Administered 2021-09-10: 40 ug/kg/min via INTRAVENOUS
  Filled 2021-09-08 (×7): qty 100

## 2021-09-08 MED ORDER — PROPOFOL 500 MG/50ML IV EMUL
INTRAVENOUS | Status: DC | PRN
  Administered 2021-09-08: 50 ug/kg/min via INTRAVENOUS

## 2021-09-08 MED ORDER — LACTATED RINGERS IV SOLN: INTRAVENOUS | Status: AC

## 2021-09-08 MED ORDER — ROCURONIUM BROMIDE 10 MG/ML (PF) SYRINGE
PREFILLED_SYRINGE | INTRAVENOUS | Status: DC | PRN
  Administered 2021-09-08: 80 mg via INTRAVENOUS

## 2021-09-08 MED ORDER — PROPOFOL 10 MG/ML IV BOLUS
INTRAVENOUS | Status: DC | PRN
  Administered 2021-09-08: 140 mg via INTRAVENOUS

## 2021-09-08 MED ORDER — CHLORHEXIDINE GLUCONATE CLOTH 2 % EX PADS: 6.0000 | MEDICATED_PAD | Freq: Once | CUTANEOUS | Status: DC

## 2021-09-08 MED ORDER — DEXAMETHASONE SODIUM PHOSPHATE 4 MG/ML IJ SOLN
4.0000 mg | Freq: Four times a day (QID) | INTRAMUSCULAR | Status: AC
  Administered 2021-09-09 – 2021-09-10 (×8): 4 mg via INTRAVENOUS
  Filled 2021-09-08 (×8): qty 1

## 2021-09-08 MED ORDER — EPHEDRINE 5 MG/ML INJ
INTRAVENOUS | Status: AC
  Filled 2021-09-08: qty 5

## 2021-09-08 MED ORDER — CHLORHEXIDINE GLUCONATE 0.12% ORAL RINSE (MEDLINE KIT)
15.0000 mL | Freq: Two times a day (BID) | OROMUCOSAL | Status: DC
  Administered 2021-09-08 – 2021-09-11 (×7): 15 mL via OROMUCOSAL

## 2021-09-08 MED ORDER — SODIUM CHLORIDE 0.9 % IV SOLN
3.0000 g | INTRAVENOUS | Status: AC
  Administered 2021-09-08: 3 g via INTRAVENOUS
  Filled 2021-09-08 (×2): qty 8

## 2021-09-08 MED ORDER — ROCURONIUM BROMIDE 10 MG/ML (PF) SYRINGE
PREFILLED_SYRINGE | INTRAVENOUS | Status: AC
  Filled 2021-09-08: qty 10

## 2021-09-08 MED ORDER — OXYMETAZOLINE HCL 0.05 % NA SOLN
NASAL | Status: DC | PRN
  Administered 2021-09-08: 2 via NASAL

## 2021-09-08 MED ORDER — ONDANSETRON HCL 4 MG/2ML IJ SOLN
INTRAMUSCULAR | Status: AC
  Filled 2021-09-08: qty 2

## 2021-09-08 MED ORDER — KETOROLAC TROMETHAMINE 15 MG/ML IJ SOLN
15.0000 mg | Freq: Once | INTRAMUSCULAR | Status: AC
  Administered 2021-09-08: 15 mg via INTRAVENOUS
  Filled 2021-09-08: qty 1

## 2021-09-08 MED ORDER — DEXMEDETOMIDINE (PRECEDEX) IN NS 20 MCG/5ML (4 MCG/ML) IV SYRINGE
PREFILLED_SYRINGE | INTRAVENOUS | Status: DC | PRN
  Administered 2021-09-08: 8 ug via INTRAVENOUS
  Administered 2021-09-08: 12 ug via INTRAVENOUS

## 2021-09-08 MED ORDER — ORAL CARE MOUTH RINSE
15.0000 mL | OROMUCOSAL | Status: DC
  Administered 2021-09-09 – 2021-09-11 (×29): 15 mL via OROMUCOSAL

## 2021-09-08 MED ORDER — ONDANSETRON HCL 4 MG/2ML IJ SOLN
INTRAMUSCULAR | Status: DC | PRN
  Administered 2021-09-08: 4 mg via INTRAVENOUS

## 2021-09-08 MED ORDER — INSULIN ASPART 100 UNIT/ML IJ SOLN
0.0000 [IU] | INTRAMUSCULAR | Status: DC
  Administered 2021-09-09 (×2): 3 [IU] via SUBCUTANEOUS

## 2021-09-08 MED ORDER — FENTANYL CITRATE (PF) 250 MCG/5ML IJ SOLN
INTRAMUSCULAR | Status: AC
  Filled 2021-09-08: qty 5

## 2021-09-08 MED ORDER — FENTANYL CITRATE (PF) 100 MCG/2ML IJ SOLN
25.0000 ug | INTRAMUSCULAR | Status: DC | PRN
  Administered 2021-09-09: 50 ug via INTRAVENOUS
  Administered 2021-09-09 (×2): 100 ug via INTRAVENOUS
  Administered 2021-09-09: 75 ug via INTRAVENOUS
  Administered 2021-09-10: 100 ug via INTRAVENOUS
  Filled 2021-09-08 (×6): qty 2

## 2021-09-08 MED ORDER — ACETAMINOPHEN 160 MG/5ML PO SOLN: 650.0000 mg | Freq: Four times a day (QID) | ORAL | Status: DC | PRN

## 2021-09-08 MED ORDER — SODIUM CHLORIDE 0.9 % IV SOLN
3.0000 g | Freq: Three times a day (TID) | INTRAVENOUS | Status: DC
  Administered 2021-09-08 – 2021-09-13 (×16): 3 g via INTRAVENOUS
  Filled 2021-09-08 (×16): qty 8

## 2021-09-08 MED ORDER — LACTATED RINGERS IV SOLN: INTRAVENOUS | Status: DC | PRN

## 2021-09-08 MED ORDER — CHLORHEXIDINE GLUCONATE CLOTH 2 % EX PADS
6.0000 | MEDICATED_PAD | Freq: Every day | CUTANEOUS | Status: DC
  Administered 2021-09-08 – 2021-09-13 (×6): 6 via TOPICAL

## 2021-09-08 MED ORDER — DEXAMETHASONE SODIUM PHOSPHATE 10 MG/ML IJ SOLN
INTRAMUSCULAR | Status: DC | PRN
  Administered 2021-09-08: 10 mg via INTRAVENOUS

## 2021-09-08 MED ORDER — SUCCINYLCHOLINE CHLORIDE 200 MG/10ML IV SOSY
PREFILLED_SYRINGE | INTRAVENOUS | Status: DC | PRN
  Administered 2021-09-08: 140 mg via INTRAVENOUS

## 2021-09-08 MED ORDER — PHENYLEPHRINE 40 MCG/ML (10ML) SYRINGE FOR IV PUSH (FOR BLOOD PRESSURE SUPPORT)
PREFILLED_SYRINGE | INTRAVENOUS | Status: AC
  Filled 2021-09-08: qty 10

## 2021-09-08 MED ORDER — BUPIVACAINE-EPINEPHRINE (PF) 0.25% -1:200000 IJ SOLN
INTRAMUSCULAR | Status: AC
  Filled 2021-09-08: qty 30

## 2021-09-08 MED ORDER — LIDOCAINE 2% (20 MG/ML) 5 ML SYRINGE
INTRAMUSCULAR | Status: DC | PRN
  Administered 2021-09-08: 20 mg via INTRAVENOUS

## 2021-09-08 MED ORDER — DEXAMETHASONE SODIUM PHOSPHATE 10 MG/ML IJ SOLN
INTRAMUSCULAR | Status: AC
  Filled 2021-09-08: qty 1

## 2021-09-08 MED ORDER — FENTANYL CITRATE (PF) 100 MCG/2ML IJ SOLN
INTRAMUSCULAR | Status: DC | PRN
  Administered 2021-09-08: 100 ug via INTRAVENOUS
  Administered 2021-09-08: 50 ug via INTRAVENOUS
  Administered 2021-09-08: 100 ug via INTRAVENOUS

## 2021-09-08 MED ORDER — LABETALOL HCL 5 MG/ML IV SOLN
10.0000 mg | INTRAVENOUS | Status: DC | PRN
  Administered 2021-09-08 – 2021-09-12 (×6): 10 mg via INTRAVENOUS
  Filled 2021-09-08 (×7): qty 4

## 2021-09-08 MED ORDER — BUPIVACAINE-EPINEPHRINE (PF) 0.25% -1:200000 IJ SOLN
INTRAMUSCULAR | Status: DC | PRN
  Administered 2021-09-08: 6 mL

## 2021-09-08 MED ORDER — LIDOCAINE HCL (PF) 2 % IJ SOLN
INTRAMUSCULAR | Status: AC
  Filled 2021-09-08: qty 5

## 2021-09-08 MED ORDER — PROPOFOL 10 MG/ML IV BOLUS
INTRAVENOUS | Status: AC
  Filled 2021-09-08: qty 20

## 2021-09-08 SURGICAL SUPPLY — 34 items
BAG COUNTER SPONGE SURGICOUNT (BAG) ×2 IMPLANT
BAG SPNG CNTER NS LX DISP (BAG) ×1
BLADE SURG 15 STRL LF DISP TIS (BLADE) ×1 IMPLANT
BLADE SURG 15 STRL SS (BLADE) ×2
BNDG CONFORM 2 STRL LF (GAUZE/BANDAGES/DRESSINGS) ×2 IMPLANT
BNDG GAUZE ELAST 4 BULKY (GAUZE/BANDAGES/DRESSINGS) ×1 IMPLANT
COVER SURGICAL LIGHT HANDLE (MISCELLANEOUS) ×4 IMPLANT
DRAIN PENROSE 1/4X12 LTX STRL (WOUND CARE) ×2 IMPLANT
DRSG PAD ABDOMINAL 8X10 ST (GAUZE/BANDAGES/DRESSINGS) IMPLANT
ELECT COATED BLADE 2.86 ST (ELECTRODE) IMPLANT
ELECT REM PT RETURN 9FT ADLT (ELECTROSURGICAL) ×2
ELECTRODE REM PT RTRN 9FT ADLT (ELECTROSURGICAL) ×1 IMPLANT
GAUZE 4X4 16PLY ~~LOC~~+RFID DBL (SPONGE) ×2 IMPLANT
GAUZE SPONGE 4X4 12PLY STRL (GAUZE/BANDAGES/DRESSINGS) IMPLANT
GLOVE SURG ENC MOIS LTX SZ8 (GLOVE) ×2 IMPLANT
GOWN STRL REUS W/ TWL LRG LVL3 (GOWN DISPOSABLE) ×1 IMPLANT
GOWN STRL REUS W/TWL LRG LVL3 (GOWN DISPOSABLE) ×2
KIT BASIN OR (CUSTOM PROCEDURE TRAY) ×2 IMPLANT
KIT TURNOVER KIT B (KITS) ×2 IMPLANT
MARKER SKIN DUAL TIP RULER LAB (MISCELLANEOUS) ×2 IMPLANT
NDL HYPO 25GX1X1/2 BEV (NEEDLE) ×1 IMPLANT
NEEDLE HYPO 25GX1X1/2 BEV (NEEDLE) ×2 IMPLANT
NS IRRIG 1000ML POUR BTL (IV SOLUTION) ×2 IMPLANT
PACK SURGICAL SETUP 50X90 (CUSTOM PROCEDURE TRAY) ×2 IMPLANT
PAD ARMBOARD 7.5X6 YLW CONV (MISCELLANEOUS) ×4 IMPLANT
PENCIL BUTTON HOLSTER BLD 10FT (ELECTRODE) IMPLANT
POSITIONER HEAD DONUT 9IN (MISCELLANEOUS) IMPLANT
SLEEVE IRRIGATION ELITE 7 (MISCELLANEOUS) IMPLANT
SUT ETHILON 2 0 FS 18 (SUTURE) ×2 IMPLANT
SWAB COLLECTION DEVICE MRSA (MISCELLANEOUS) ×2 IMPLANT
SWAB CULTURE ESWAB REG 1ML (MISCELLANEOUS) ×2 IMPLANT
SYR BULB IRRIG 60ML STRL (SYRINGE) ×2 IMPLANT
SYR CONTROL 10ML LL (SYRINGE) ×2 IMPLANT
TUBE CONNECTING 12X1/4 (SUCTIONS) ×2 IMPLANT

## 2021-09-08 NOTE — ED Notes (Signed)
Pt has 3+ swelling of neck. Pt is able to maintain secretions in mouth and is able to speak in complete sentences. Pt's breathing is eupnea.

## 2021-09-08 NOTE — Anesthesia Preprocedure Evaluation (Addendum)
Anesthesia Evaluation  Patient identified by MRN, date of birth, ID band Patient awake    Reviewed: Allergy & Precautions, NPO status , Patient's Chart, lab work & pertinent test results  History of Anesthesia Complications Negative for: history of anesthetic complications  Airway Mallampati: III  TM Distance: >3 FB Neck ROM: Full  Mouth opening: Limited Mouth Opening  Dental  (+) Dental Advisory Given   Pulmonary  09/07/2021 COVID POSITIVE   breath sounds clear to auscultation       Cardiovascular hypertension, Pt. on medications (-) angina Rhythm:Regular Rate:Normal     Neuro/Psych negative neurological ROS  negative psych ROS   GI/Hepatic negative GI ROS, Neg liver ROS,   Endo/Other  diabetes (diet controlled), Well Controlled, Type 2  Renal/GU negative Renal ROS     Musculoskeletal negative musculoskeletal ROS (+)   Abdominal   Peds  Hematology negative hematology ROS (+)   Anesthesia Other Findings Covid+, asymptomatic Ludwig's angina CT Neck - IMPRESSION: 1. Large abscess of the sublingual space with extension into the left submandibular space. 2. No airway stenosis.   Reproductive/Obstetrics                           Anesthesia Physical Anesthesia Plan  ASA: 2 and emergent  Anesthesia Plan: General   Post-op Pain Management:    Induction: Intravenous  PONV Risk Score and Plan: 3 and Treatment may vary due to age or medical condition, Ondansetron and Dexamethasone  Airway Management Planned: Video Laryngoscope Planned and Nasal ETT  Additional Equipment: None  Intra-op Plan:   Post-operative Plan: Possible Post-op intubation/ventilation  Informed Consent: I have reviewed the patients History and Physical, chart, labs and discussed the procedure including the risks, benefits and alternatives for the proposed anesthesia with the patient or authorized representative who  has indicated his/her understanding and acceptance.     Dental advisory given and Interpreter used for interveiw  Plan Discussed with: CRNA, Anesthesiologist and Surgeon  Anesthesia Plan Comments:        Anesthesia Quick Evaluation

## 2021-09-08 NOTE — H&P (Signed)
H&P Infection  Exam Date: 09/08/21  ID: The patient is a 75 yoF who presented with pain and swelling of the submandibular/sublingual space.  History of Present Illness:  The patient reports having swelling the developed starting on Saturday/Sunday and went to the dentist on Monday during which tooth #18 was removed and she was not placed on any antibiotics. Since then the swelling has gotten worse and presented to the ED. Per ED report initially presentation was thought to be angioedema and she was given steroids however CT neck showed submandibular abscess and she was started on Unasyn.   The patient's is NPO. She is [place] and requires interpretive services. Also to note she is COVID positive and asymptomatic in this regard. WBC 19. She reports she is protecting her own airway and denies pain, trismus, dyspnea, or dysphagia.    Clinical Exam: Extraoral Exam:              Patient is alert, orientated and in no distress             CN II-XII grossly intact             There is appreciable facial swelling face/neck bilaterally             The swelling extends into the neck region    Intraoral Exam:              The patient does not have trismus with maximum incisal opening of 50 mm.             The buccal vestibules are not raised.             The floor of mouth is indurated and elevated bilaterally             The tongue is elevated.             There is not lateral pharyngeal swelling with the uvula midline             There is no palatal draping present.             Oral airway is patent              No caries noted with recent extraction site #18                Cardiovascular:  Regular rate and rhythm without any appreciable murmurs, gallops or rubs.               Respiratory: Lungs were clear to auscultation bilaterally without any wheezing or rhonchi.             Abdomen: Non-distended    Radiographic Exam:               Panorex shows no carious teeth or periapical  radiolucencies, there is a recent extraction site #18 with no retained root remnants.                           A CT of the larynx with contrast was obtained showing fluid collections/cellulitis associated with the bilateral submandibular/sublingual/submental spaces. There is no airway deviation which appears patent.    Assessment: 81 yoF w/ DM2 with a bilateral submandibular, sublingual, and submental space infection consistent with Ludwig's angina.    Plan:  The patient will require incision and drainage of the bilateral submandibular, sublingual, and submental space infection with extraction of teeth as necessary in the OR.  Consent  was obtained using [place] interpreter services and will be scanned into EPIC.   Risks, complications and alternatives of tooth extraction and incision and drainage procedures were discussed and questions were answered.  Among all potential risks and complications, I emphasized the potential for pain, bleeding, swelling, infection, localized alveolar osteitis (dry socket), temporary and permanent lingual and inferior alveolar nerve injury, oroantral (sinus) communication, oronasal communication, jaw fracture, damage to adjacent teeth and tissue, joint discomfort, bone/tooth fragments, recurrence of infection, need for additional procedures, facial nerve injury, scarring, limited mouth opening, drain placement, aspiration and anesthetic mishap.  Herbie Saxon, DDS Oral and Maxillofacial Surgeon [facility] [place]) Office # [phone] Cell # [phone]

## 2021-09-08 NOTE — Hospital Course (Signed)
Shannon Stephenson is a 80 year old female with a past medical history of Type II Diabetes Mellitus, hypertension, spinal stenosis s/p lumbar laminectomy in 2014 who presented with tongue swelling and was admitted for a submandibular abscess requiring surgery.    #Submandibular Abscess Patient presented yesterday, after a tooth extraction on 9/26, with pain and difficulty breathing. Patient was started on unasyn overnight after CT scan showed submandibular abscess. Leukocytosis to 19.1 today from 17 at admission. Patient is hemodynamically stable and able to maintain her airway. Swollen neck with lymphadenopathy noted on exam. Oral surgery was consulted, and orthopantogram was ordered. Dental amalgam noted, but no periapical lucencies were identified, mandible demonstrated normal alignment, and there were no radiographic findings of a peridontal abscess. She is scheduled for incision and drainage of submandibular abscess this afternoon. Patient will remain NPO for surgery.  - Continue unasyn 3g in NaCl 0.9% IVPB - Blood cultures NGTD - Dilaudid 0.25mg  IV Q2 PRN for severe pain   #Hypertension Patient takes lisinopril and HCTZ at home. There is no longer suspicion for angioedema, so patient will resume this, along with HCTZ, when able to take oral meds. - Holding lisinopril and HCTZ - Labetalol 10mg  IV PRN for SBP > 180   #Asymptomatic COVID-19 infection Patient tested positive for COVID on admission. Patient is not having symptoms, and saturating well on room air. No intervention required at this time.  -COVID precautions    #Diet controlled DMII Patient has a history of diabetes per chart review but all recent A1cs are within the normal range. Last A1c was 5.4% in April. Sugars slightly elevated in the setting of recent solumedrol dose.  We will continue to monitor blood sugar.

## 2021-09-08 NOTE — Anesthesia Procedure Notes (Addendum)
Procedure Name: Intubation Date/Time: 09/08/2021 7:05 PM Performed by: Jaimon Bugaj T, CRNA Pre-anesthesia Checklist: Patient identified, Emergency Drugs available, Suction available and Patient being monitored Patient Re-evaluated:Patient Re-evaluated prior to induction Oxygen Delivery Method: Circle system utilized Preoxygenation: Pre-oxygenation with 100% oxygen Induction Type: IV induction, Rapid sequence and Cricoid Pressure applied Ventilation: Mask ventilation without difficulty Laryngoscope Size: Glidescope and 3 Grade View: Grade I Nasal Tubes: Nasal prep performed, Nasal Rae and Right Tube size: 7.0 mm Number of attempts: 1 Airway Equipment and Method: Video-laryngoscopy Placement Confirmation: ETT inserted through vocal cords under direct vision, positive ETCO2 and breath sounds checked- equal and bilateral Secured at: 26 cm Tube secured with: Tape Dental Injury: Teeth and Oropharynx as per pre-operative assessment

## 2021-09-08 NOTE — Progress Notes (Signed)
2100: Pt arrived from OR. Nasally intubated on Prop at . E-link notified for orders and ground team contacted.  2130: Pt BP 212/91. PRN Labetalol given . IV team consulted for additional access.  2200: Pt daughter called and updated.  0600: Dressing to neck changed.

## 2021-09-08 NOTE — ED Notes (Signed)
Patient transported to X-ray 

## 2021-09-08 NOTE — Op Note (Signed)
PRE-OPERATIVE DIAGNOSIS:  Odontogenic infection POST-OPERATIVE DIAGNOSIS:  Same    SURGEON:  Surgeon(s) and Role:    Ross Marcus, Lavell Anchors, DMD - Primary   Procedure: Extra-oral I&D of bilateral sublingual abscess (41015 x2) Extra-oral I&D of bilateral submandibular abscess (13086 x2) Extra-oral I&D of submental abscess (57846)   Description of Operation/Procedure:   The patient was encountered in [facility] OR Room 9.  General anesthesia was induced and an oral ETT was secured in the standard fashion.  The table was moved slightly away from anesthesia and the patient was properly padded, relieving all pressure points.  A formal time-out was executed.  2% lidocaine with 1:100,000 epinephrine was infiltrated into the proposed surgical sites.  The patient was prepped and draped in the standard sterile fashion and a throat pack was placed.                Attention was then directed intraorally - a full thickness mucoperiosteal flap was elevated on the lingual aspect of the mandible and careful dissection ensued into the sublingual space bilaterally yielding moderate purulent drainage. This was swabbed and sent for aerobic, anaerobic and fungal cultures.   Transcutaneous drainage sites were then carefully marked beneath the inferior border of the mandible to allow dependent drainage, avoid neurovascular structures, and to promote esthetics.  Incisions were made in the bilateral submandibular areas and submental area through skin and subcutaneous tissues and hemostasis was obtained.  The platysma was identified and divided.  Blunt dissection was carried to the mandible with intraoral digital guidance.  The following spaces were bluntly explored yielding purulent drainage: submandibular, sublingual, and submental.   Intraoral access was used to assure proper 1/4'" Penrose drain placement into each fascial space.  A total of 4 drains were placed and secured with 3-0 nylon sutures in the standard fashion to  allow for post-surgical advancement when appropriate.  All areas were thoroughly irrigated.  The full thickness mucoperiosteal flap was reapproximated with 3-0 chromic gut suture.  A burn net dressing was fashioned to secure kerlix fluffs over the extraoral drains.   The oral cavity was suctioned free of all debris and secretions. The throat pack was removed and an orogastric tube was passed to evacuate the stomach contents.  Sponge and needle counts were correct x 2.  Care of the patient was turned over to the Anesthesia team -- due to the severity of the infection and airway edema visualized during laryngoscopy the decision was made to leave the patient intubated and transferred to critical care unit.   ANESTHESIA:   general EBL:  30 mL  DRAINS: Penrose drain in the bilateral neck X 4 LOCAL MEDICATIONS USED:  LIDOCAINE 2% w/ 1:100000 epi SPECIMEN:  Aspirate - cultures for anaerobes, aerobes, and fungal PLAN OF CARE: Return to floor PATIENT DISPOSITION:  PACU - hemodynamically stable. Delay start of Pharmacological VTE agent (>24hrs) due to surgical blood loss or risk of bleeding: no   OMFS Recommendations -Leave patient intubated in ICU at minimum of 24 hours to allow time to assess response to surgery, may consider extubation on 9/30 -Recommend decadron q8hr  -Recommend ID consult given rapid onset/spread of infection, patient's immunocompromised status, and presence of gas on CT scan; agree with Unasyn 3g q6hr and consider adding Clindamycin and/or Vancomycin -Would anticipate patient to be inpatient for 4 days pending clinical course -Liquid diet once extubated and will advance to soft mechanical as patient tolerates -Peridex (chlorhexidine) mouthrnise QID -Change Kerlix dressing as needed -Follow cultures -  Daily CBC w/ diff      Herbie Saxon, DDS Oral and Maxillofacial Surgeon [facility] [place]) Office # [phone] Cell # [phone]

## 2021-09-08 NOTE — Progress Notes (Signed)
eLink Physician-Brief Progress Note Patient Name: Shannon Stephenson DOB: June 25, 1941 MRN: [id number]   Date of Service  09/08/2021  HPI/Events of Note   80 year old woman with PMH of DM2, HTN who presented after a dental procedure with a sublingual and left submandibular abscess. Patient arrives in ICU intubated and ventilated. Needs ventilator, sedation, CXR and ABG orders.  eICU Interventions  Plan: Ventilator orders: 30%/PRVC 12/TV 450/P 5. ABG STAT. Portable CXR STAT. Propofol IV infusion. Titrate to RASS = 0 to -1.     Intervention Category Evaluation Type: New Patient Evaluation  Lenell Antu 09/08/2021, 9:17 PM

## 2021-09-08 NOTE — Transfer of Care (Signed)
Immediate Anesthesia Transfer of Care Note  Patient: Shannon Stephenson  Procedure(s) Performed: INCISION AND DRAINAGE NECK ABSCESS  Patient Location: ICU  Anesthesia Type:General  Level of Consciousness: Patient remains intubated per anesthesia plan  Airway & Oxygen Therapy: Patient remains intubated per anesthesia plan and Patient placed on Ventilator (see vital sign flow sheet for setting)  Post-op Assessment: Report given to RN and Post -op Vital signs reviewed and stable  Post vital signs: Reviewed and stable  Last Vitals:  Vitals Value Taken Time  BP 183/77 09/08/21 2115  Temp    Pulse 84 09/08/21 2121  Resp 11 09/08/21 2121  SpO2 98 % 09/08/21 2121  Vitals shown include unvalidated device data.  Last Pain:  Vitals:   09/08/21 1737  TempSrc: Oral  PainSc:          Complications: No notable events documented.

## 2021-09-08 NOTE — Progress Notes (Addendum)
Shannon Stephenson is a 80 year old female with a past medical history of Type II Diabetes Mellitus, hypertension, spinal stenosis s/p lumbar laminectomy in 2014 who presented with tongue swelling and was admitted for a submandibular abscess requiring surgery.   Subjective:  Patient was doing alright this morning. Her main complaint was that she had a headache that started yesterday. She endorsed difficulty breathing and difficulty swallowing. She denied body aches and has not felt feverish. She knows that she has an infection in her mouth and understood the need for surgery.  Objective:  Vital signs in last 24 hours: Vitals:   09/08/21 0900 09/08/21 1000 09/08/21 1100 09/08/21 1200  BP: (!) 155/63 (!) 168/74 (!) 165/73 (!) 131/50  Pulse: 83 84 79 69  Resp: 15 14 (!) 22 13  Temp:      TempSrc:      SpO2: 94% 94% 97% 97%   Weight change:   Intake/Output Summary (Last 24 hours) at 09/08/2021 1305 Last data filed at 09/08/2021 0603 Gross per 24 hour  Intake 300 ml  Output --  Net 300 ml   Physical Exam Vitals reviewed.  Constitutional:      General: She is not in acute distress.    Appearance: She is ill-appearing.  HENT:     Head: Normocephalic and atraumatic.     Mouth/Throat:     Mouth: Mucous membranes are moist.  Neck:     Comments: Neck was firm bilaterally, swollen submandibular lymph nodes bilaterally. Patient had normal flexion and extension of the neck. Cardiovascular:     Rate and Rhythm: Normal rate and regular rhythm.     Heart sounds: Normal heart sounds.  Pulmonary:     Effort: No respiratory distress.  Abdominal:     General: Abdomen is flat. There is no distension.     Palpations: Abdomen is soft.  Musculoskeletal:     Cervical back: No rigidity.  Skin:    General: Skin is warm and dry.  Neurological:     Mental Status: She is alert and oriented to person, place, and time.  Psychiatric:        Mood and Affect: Mood normal.        Behavior: Behavior  normal.     Assessment/Plan:  Principal Problem:   Submandibular abscess Active Problems:   T2DM (type 2 diabetes mellitus) (HCC)   HTN (hypertension)  Shannon Stephenson is a 80 year old female with a past medical history of Type II Diabetes Mellitus, hypertension, spinal stenosis s/p lumbar laminectomy in 2014 who presented with tongue swelling and was admitted for a submandibular abscess requiring surgery.    #Submandibular Abscess Patient presented yesterday, after a tooth extraction on 9/26, with pain and difficulty breathing. Patient was started on unasyn overnight after CT scan showed submandibular abscess. Leukocytosis to 19.1 today from 17 at admission. Patient is hemodynamically stable and able to maintain her airway. Swollen neck with lymphadenopathy noted on exam. Oral surgery was consulted, and orthopantogram was ordered. Dental amalgam noted, but no periapical lucencies were identified, mandible demonstrated normal alignment, and there were no radiographic findings of a peridontal abscess. She is scheduled for incision and drainage of submandibular abscess this afternoon. Patient will remain NPO for surgery. - Oral surgery consulted, appreciate assistance - Continue unasyn 3g in NaCl 0.9% IVPB - Blood cultures NGTD - Dilaudid 0.25mg  IV Q2 PRN for severe pain  #Hypertension Patient takes lisinopril and HCTZ at home. There is no longer suspicion for angioedema, so patient will  resume this, along with HCTZ, when able to take oral meds. - Holding lisinopril and HCTZ - Labetalol 10mg  IV PRN for SBP > 180   #Asymptomatic COVID-19 infection Patient tested positive for COVID on admission. Patient is not having symptoms, and saturating well on room air. No intervention required at this time.  -COVID precautions    #Diet controlled DMII Patient has a history of diabetes per chart review but all recent A1cs are within the normal range. Last A1c was 5.4% in April. Sugars slightly  elevated in the setting of recent solumedrol dose.  We will continue to monitor blood sugar.  Diet: NPO VTE: SCDs IVF: 50 mL/hr LR Code: Full   Prior to Admission Living Arrangement: Home, living with family Anticipated Discharge Location: Home Barriers to Discharge: Surgery Dispo: Anticipate discharge in 2 to 3 days   LOS: 1 day   May, Medical Student 09/08/2021, 1:05 PM  Attestation for Student Documentation:  I personally was present and performed or re-performed the history, physical exam and medical decision-making activities of this service and have verified that the service and findings are accurately documented in the student's note.  09/10/2021, MD 09/08/2021, 2:12 PM

## 2021-09-08 NOTE — Anesthesia Postprocedure Evaluation (Signed)
Anesthesia Post Note  Patient: Shannon Stephenson  Procedure(s) Performed: INCISION AND DRAINAGE NECK ABSCESS     Patient location during evaluation: ICU Anesthesia Type: General Level of consciousness: sedated and patient remains intubated per anesthesia plan Pain management: pain level controlled Vital Signs Assessment: post-procedure vital signs reviewed and stable Respiratory status: patient on ventilator - see flowsheet for VS and patient remains intubated per anesthesia plan Cardiovascular status: blood pressure returned to baseline and stable Postop Assessment: no apparent nausea or vomiting Anesthetic complications: no Comments: Report to ICU MD   No notable events documented.  Last Vitals:  Vitals:   09/08/21 1737 09/08/21 1800  BP:  (!) 200/75  Pulse:  79  Resp:  19  Temp: 36.8 C   SpO2:  99%    Last Pain:  Vitals:   09/08/21 1737  TempSrc: Oral  PainSc:                  Shayle Donahoo,E. Laryssa Hassing

## 2021-09-08 NOTE — Consult Note (Signed)
NAME:  Shannon Stephenson, MRN:  [id number], DOB:  05-31-1941, LOS: 1 ADMISSION DATE:  09/07/2021, CONSULTATION DATE:  09/08/21 REFERRING MD:  Ross Marcus CHIEF COMPLAINT:  Oral pain   History of Present Illness:  Shannon Stephenson is a 80 y.o. female who has a PMH as below and who presented to [facility] ED 9/28 with oral pain and swelling of submandibular / sublingual space that started 3 - 4 days prior.  She went to the dentist the following day (Monday 9/26) and reportedly had tooth 18 removed.  Since then, swelling worsened to the point that she came to ED.  She was given steroids for initial presumption of angioedema; however, CT neck showed a submandibular abscess.  She was subsequently started on Unasyn and she was taken to the OR by dental surgery for I&D of bilateral sublingual abscess, bilateral submandibular abscess, submental abscess.   Per anesthesia, upon intubation (nasally) she had a swollen glottis.  She received 10mg  Decadron and was left intubated upon completion of the case.  She was incidentally found to be COVID positive and per chart review, has been asymptomatic.  Pertinent  Medical History:  has Spinal stenosis of lumbar region; Ludwig's angina; T2DM (type 2 diabetes mellitus) (HCC); HTN (hypertension); and Mouth abscess on their problem list.  Significant Hospital Events: Including procedures, antibiotic start and stop dates in addition to other pertinent events   9/28 > admit, taken to OR for dental surgery (OR note pending).  Interim History / Subjective:  Sedated on vent.  Objective:  Blood pressure (!) 200/75, pulse 79, temperature 98.3 F (36.8 C), temperature source Oral, resp. rate 19, weight 64.3 kg, SpO2 99 %.    Vent Mode: PRVC FiO2 (%):  [30 %] 30 % Set Rate:  [12 bmp] 12 bmp Vt Set:  [450 mL] 450 mL   Intake/Output Summary (Last 24 hours) at 09/08/2021 2146 Last data filed at 09/08/2021 2100 Gross per 24 hour  Intake 1200 ml  Output 100 ml  Net 1100 ml    Filed Weights   09/08/21 2107  Weight: 64.3 kg    Examination (performed from doorway and with assistance of RN): General: Adult female, resting in bed, in NAD. Neuro: Sedated, not responsive. HEENT: [place]/AT. Sclerae anicteric. Nasally intubated. Cardiovascular: RRR, no M/R/G.  Lungs: Respirations even and unlabored.  Coarse bilaterally. Abdomen: BS x 4, soft, NT/ND.  Musculoskeletal: No gross deformities, no edema.  Skin: Intact, warm, no rashes.   Labs/imaging personally reviewed:  CT soft tissue neck 9/28 > large abscess of sublingual space with extension into the left submandibular space.  Resolved Hospital Problem list:    Assessment & Plan:   Submandibular abscess - s/p OR for I&D of bilateral sublingual abscess, bilateral submandibular abscess, submental abscess.  - Continue empiric unasyn. - Follow cultures. - Post op care per oral surgery.  Acute hypoxic respiratory failure with swollen glottis - presumed 2/2 above.  Received 10mg  Decadron post intubation. - Continue full vent support. - Daily SBT. - Ensure good cuff leak prior to considering extubation. - Continue empiric decadron, 4mg  q6hrs x 2 days. - Bronchial hygiene. - Follow CXR.  COVID 19 infection - incidental finding.  Asymptomatic per report. - Supportive care.  Hx HTN. - Hold home Lisinopril, HCTZ. - Labetalol PRN and Hydralazine PRN for SBP > 180.  Hx DM (last Hgb A1c 5.4 in April 2022).  - SSI for now in anticipation of worsening hyperglycemia after steroids.  Best practice (evaluated daily):  Diet/type: NPO DVT prophylaxis: SCD GI prophylaxis: PPI Lines: N/A Foley:  N/A Code Status:  full code Last date of multidisciplinary goals of care discussion: None.  Labs   CBC: Recent Labs  Lab 09/07/21 1950 09/08/21 0448  WBC 17.7* 19.1*  NEUTROABS 16.5*  --   HGB 12.2 12.4  HCT 38.3 39.0  MCV 95.3 96.3  PLT 210 215    Basic Metabolic Panel: Recent Labs  Lab 09/07/21 1950  09/08/21 0448  NA 136 137  K 3.8 4.6  CL 104 104  CO2 22 25  GLUCOSE 226* 195*  BUN 17 17  CREATININE 0.92 0.82  CALCIUM 8.6* 8.7*   GFR: CrCl cannot be calculated (Unknown ideal weight.). Recent Labs  Lab 09/07/21 1950 09/08/21 0448  WBC 17.7* 19.1*    Liver Function Tests: Recent Labs  Lab 09/08/21 0448  AST 51*  ALT 58*  ALKPHOS 102  BILITOT 0.8  PROT 6.5  ALBUMIN 2.6*   No results for input(s): LIPASE, AMYLASE in the last 168 hours. No results for input(s): AMMONIA in the last 168 hours.  ABG No results found for: PHART, PCO2ART, PO2ART, HCO3, TCO2, ACIDBASEDEF, O2SAT   Coagulation Profile: No results for input(s): INR, PROTIME in the last 168 hours.  Cardiac Enzymes: No results for input(s): CKTOTAL, CKMB, CKMBINDEX, TROPONINI in the last 168 hours.  HbA1C: No results found for: HGBA1C  CBG: Recent Labs  Lab 09/08/21 2100  GLUCAP 227*    Review of Systems:   Unable to obtain as pt is encephalopathic.  Past Medical History:  She,  has a past medical history of Back pain, Diabetes mellitus without complication (HCC), and Hypertension.   Surgical History:   Past Surgical History:  Procedure Laterality Date   LUMBAR LAMINECTOMY/DECOMPRESSION MICRODISCECTOMY Bilateral 10/09/2013   Procedure: MICRO LUMBAR DECOMPRESSION L3-L5 (2 LEVELS);  Surgeon: Javier Docker, MD;  Location: [facility];  Service: Orthopedics;  Laterality: Bilateral;   NO PAST SURGERIES       Social History:   reports that she has never smoked. She has never used smokeless tobacco. She reports that she does not drink alcohol and does not use drugs.   Family History:  Her family history is negative for Breast cancer.   Allergies No Known Allergies   Home Medications  Prior to Admission medications   Medication Sig Start Date End Date Taking? Authorizing Provider  acetaminophen (TYLENOL) 500 MG tablet Take 500 mg by mouth every 6 (six) hours as needed for pain.   Yes  [provider]  alendronate (FOSAMAX) 70 MG tablet Take 70 mg by mouth once a week. 08/22/21  Yes [provider]  amoxicillin (AMOXIL) 500 MG tablet Take 1-2 tablets by mouth See admin instructions. TAKE 2 TABLETS BY MOUTH NOW AND THEN 1 TABLET BY MOUTH EVERY 8 HOURS FOR 5 DAYS UNTIL GONE. 09/06/21  Yes [provider]  hydrochlorothiazide (HYDRODIURIL) 25 MG tablet Take 25 mg by mouth every morning.    Yes [provider]  ibuprofen (ADVIL) 600 MG tablet Take 600 mg by mouth every 6 (six) hours as needed for pain. 09/06/21  Yes [provider]  traMADol (ULTRAM) 50 MG tablet Take 1 tablet (50 mg total) by mouth every 6 (six) hours as needed for pain. 10/09/13  Yes Jene Every, MD  conjugated estrogens (PREMARIN) vaginal cream Place 1 Applicatorful vaginally daily. Patient not taking: No sig reported 01/24/17   Audry Pili, PA-C     Critical care time:  35 min.   Rutherford Guys, PA - C  Pulmonary & Critical Care Medicine For pager details, please see AMION or use Epic chat  After 1900, please call [facility] for cross coverage needs 09/08/2021, 9:46 PM

## 2021-09-09 ENCOUNTER — Encounter (HOSPITAL_COMMUNITY): Payer: Self-pay | Admitting: Oral Surgery

## 2021-09-09 DIAGNOSIS — K122 Cellulitis and abscess of mouth: Secondary | ICD-10-CM | POA: Diagnosis not present

## 2021-09-09 DIAGNOSIS — E119 Type 2 diabetes mellitus without complications: Secondary | ICD-10-CM | POA: Diagnosis not present

## 2021-09-09 DIAGNOSIS — I10 Essential (primary) hypertension: Secondary | ICD-10-CM | POA: Diagnosis not present

## 2021-09-09 DIAGNOSIS — J9601 Acute respiratory failure with hypoxia: Secondary | ICD-10-CM | POA: Diagnosis not present

## 2021-09-09 LAB — BASIC METABOLIC PANEL
Anion gap: 11 (ref 5–15)
BUN: 29 mg/dL — ABNORMAL HIGH (ref 8–23)
CO2: 26 mmol/L (ref 22–32)
Calcium: 8.3 mg/dL — ABNORMAL LOW (ref 8.9–10.3)
Chloride: 104 mmol/L (ref 98–111)
Creatinine, Ser: 0.94 mg/dL (ref 0.44–1.00)
GFR, Estimated: >60 mL/min (ref >60)
Glucose, Bld: 220 mg/dL — ABNORMAL HIGH (ref 70–99)
Potassium: 3.8 mmol/L (ref 3.5–5.1)
Sodium: 141 mmol/L (ref 135–145)

## 2021-09-09 LAB — GLUCOSE, CAPILLARY
Glucose-Capillary: 206 mg/dL — ABNORMAL HIGH (ref 70–99)
Glucose-Capillary: 203 mg/dL — ABNORMAL HIGH (ref 70–99)
Glucose-Capillary: 168 mg/dL — ABNORMAL HIGH (ref 70–99)
Glucose-Capillary: 138 mg/dL — ABNORMAL HIGH (ref 70–99)
Glucose-Capillary: 122 mg/dL — ABNORMAL HIGH (ref 70–99)

## 2021-09-09 LAB — CBC WITH DIFFERENTIAL/PLATELET
Abs Immature Granulocytes: 0.13 10*3/uL — ABNORMAL HIGH (ref 0.00–0.07)
Basophils Absolute: 0 10*3/uL (ref 0.0–0.1)
Basophils Relative: 0 %
Eosinophils Absolute: 0 10*3/uL (ref 0.0–0.5)
Eosinophils Relative: 0 %
HCT: 37 % (ref 36.0–46.0)
Hemoglobin: 11.9 g/dL — ABNORMAL LOW (ref 12.0–15.0)
Immature Granulocytes: 1 %
Lymphocytes Relative: 4 %
Lymphs Abs: 0.5 10*3/uL — ABNORMAL LOW (ref 0.7–4.0)
MCH: 30.4 pg (ref 26.0–34.0)
MCHC: 32.2 g/dL (ref 30.0–36.0)
MCV: 94.4 fL (ref 80.0–100.0)
Monocytes Absolute: 0.6 10*3/uL (ref 0.1–1.0)
Monocytes Relative: 4 %
Neutro Abs: 12.3 10*3/uL — ABNORMAL HIGH (ref 1.7–7.7)
Neutrophils Relative %: 91 %
Platelets: 239 10*3/uL (ref 150–400)
RBC: 3.92 MIL/uL (ref 3.87–5.11)
RDW: 12.6 % (ref 11.5–15.5)
WBC: 13.5 10*3/uL — ABNORMAL HIGH (ref 4.0–10.5)
nRBC: 0 % (ref 0.0–0.2)

## 2021-09-09 LAB — HEMOGLOBIN A1C
Hgb A1c MFr Bld: 6.8 % — ABNORMAL HIGH (ref 4.8–5.6)
Mean Plasma Glucose: 148.46 mg/dL

## 2021-09-09 LAB — TRIGLYCERIDES: Triglycerides: 171 mg/dL — ABNORMAL HIGH (ref <150)

## 2021-09-09 MED ORDER — LACTATED RINGERS IV SOLN: INTRAVENOUS | Status: AC

## 2021-09-09 MED ORDER — CLINDAMYCIN PHOSPHATE 900 MG/50ML IV SOLN
900.0000 mg | Freq: Three times a day (TID) | INTRAVENOUS | Status: AC
  Administered 2021-09-09 – 2021-09-11 (×6): 900 mg via INTRAVENOUS
  Filled 2021-09-09 (×6): qty 50

## 2021-09-09 MED ORDER — HEPARIN SODIUM (PORCINE) 5000 UNIT/ML IJ SOLN
5000.0000 [IU] | Freq: Three times a day (TID) | INTRAMUSCULAR | Status: DC
  Administered 2021-09-09 – 2021-09-13 (×12): 5000 [IU] via SUBCUTANEOUS
  Filled 2021-09-09 (×12): qty 1

## 2021-09-09 MED ORDER — INSULIN ASPART 100 UNIT/ML IJ SOLN
0.0000 [IU] | INTRAMUSCULAR | Status: DC
  Administered 2021-09-09: 3 [IU] via SUBCUTANEOUS
  Administered 2021-09-09: 4 [IU] via SUBCUTANEOUS
  Administered 2021-09-09: 7 [IU] via SUBCUTANEOUS
  Administered 2021-09-09 – 2021-09-10 (×3): 3 [IU] via SUBCUTANEOUS
  Administered 2021-09-10: 4 [IU] via SUBCUTANEOUS
  Administered 2021-09-10 (×2): 3 [IU] via SUBCUTANEOUS
  Administered 2021-09-10 – 2021-09-11 (×3): 4 [IU] via SUBCUTANEOUS

## 2021-09-09 MED ORDER — PANTOPRAZOLE SODIUM 40 MG IV SOLR
40.0000 mg | Freq: Every day | INTRAVENOUS | Status: DC
  Administered 2021-09-09 – 2021-09-10 (×2): 40 mg via INTRAVENOUS
  Filled 2021-09-09 (×2): qty 40

## 2021-09-09 NOTE — Consult Note (Signed)
[facility]  Total days of antibiotics 3/ampicillin          Reason for Consult:submandibular abscess   Referring Physician: sherwood  Principal Problem:   Ludwig's angina Active Problems:   T2DM (type 2 diabetes mellitus) ([place])   HTN (hypertension)   Mouth abscess   Endotracheal tube present   Acute respiratory failure with hypoxia (HCC)    HPI: Shannon Stephenson is a 80 y.o. female with hx of DM, HTN, who was seen in the ED on 9/27 with neck,jaw and tongue swelling after being given antibiotic and being seen by dentist yesterday. Her family reported that she had bilateral teeth pain. Initial ED impression was angioedema of tongue and sublingual spaces with erythema about submandibular region. Imaging showed a large abscess of sublingual space of 4.4 x 1.3 cm with multifocal gas with component spreading to left space. She was take to OR for IX D of bilateral sublingual and submandibular abscess. She remained intubated due to airway edema. She was started on steroids plus amp/sub to treat infection. Infectious work up also found to be covid+ but unclear if she was asymptomatic  Past Medical History:  Diagnosis Date   Back pain    Diabetes mellitus without complication ([place])    Hypertension     Allergies: No Known Allergies  Current antibiotics:   MEDICATIONS:  chlorhexidine gluconate (MEDLINE KIT)  15 mL Mouth Rinse BID   Chlorhexidine Gluconate Cloth  6 each Topical Daily   dexamethasone (DECADRON) injection  4 mg Intravenous Q6H   heparin injection (subcutaneous)  5,000 Units Subcutaneous Q8H   insulin aspart  0-20 Units Subcutaneous Q4H   mouth rinse  15 mL Mouth Rinse 10 times per day   pantoprazole (PROTONIX) IV  40 mg Intravenous Daily    Social History   Tobacco Use   Smoking status: Never   Smokeless tobacco: Never  Substance Use Topics   Alcohol use: No   Drug use: No    Family History  Problem Relation Age of Onset   Breast  cancer Neg Hx     Review of Systems - unable to obtain since patient is sedated/intubated   OBJECTIVE: Temp:  [97.8 F (36.6 C)-98.4 F (36.9 C)] 97.8 F (36.6 C) (09/29 1528) Pulse Rate:  [59-92] 59 (09/29 1500) Resp:  [12-30] 12 (09/29 1500) BP: (94-212)/(45-91) 97/45 (09/29 1500) SpO2:  [97 %-100 %] 98 % (09/29 1500) FiO2 (%):  [30 %] 30 % (09/29 1526) Weight:  [64.3 kg] 64.3 kg (09/28 2107) Physical Exam  Constitutional:  oriented to person, place, and time. appears well-developed and well-nourished. No distress.  HENT: [place]/AT, PERRLA, no scleral icterus, nasal intubation Mouth/Throat: Oropharynx is clear and moist. No oropharyngeal exudate. Neck has penrose drains in place Cardiovascular: Normal rate, regular rhythm and normal heart sounds. Exam reveals no gallop and no friction rub.  No murmur heard.  Pulmonary/Chest: Effort normal and breath sounds normal. No respiratory distress.  has no wheezes.  Neck = supple, no nuchal rigidity Abdominal: Soft. Bowel sounds are normal.  exhibits no distension. There is no tenderness.  Lymphadenopathy: no cervical adenopathy. No axillary adenopathy Neurological: alert and oriented to person, place, and time.  Skin: Skin is warm and dry. No rash noted. No erythema.  Psychiatric: a normal mood and affect.  behavior is normal.    LABS: Results for orders placed or performed during the hospital encounter of 09/07/21 (from the past 48 hour(s))  Basic metabolic  panel     Status: Abnormal   Collection Time: 09/07/21  7:50 PM  Result Value Ref Range   Sodium 136 135 - 145 mmol/L   Potassium 3.8 3.5 - 5.1 mmol/L   Chloride 104 98 - 111 mmol/L   CO2 22 22 - 32 mmol/L   Glucose, Bld 226 (H) 70 - 99 mg/dL    Comment: Glucose reference range applies only to samples taken after fasting for at least 8 hours.   BUN 17 8 - 23 mg/dL   Creatinine, Ser 0.92 0.44 - 1.00 mg/dL   Calcium 8.6 (L) 8.9 - 10.3 mg/dL   GFR, Estimated >60 >60 mL/min     Comment: (NOTE) Calculated using the CKD-EPI Creatinine Equation (2021)    Anion gap 10 5 - 15    Comment: Performed at [facility] [address], [place], [place] [address]  CBC with Differential     Status: Abnormal   Collection Time: 09/07/21  7:50 PM  Result Value Ref Range   WBC 17.7 (H) 4.0 - 10.5 K/uL   RBC 4.02 3.87 - 5.11 MIL/uL   Hemoglobin 12.2 12.0 - 15.0 g/dL   HCT 38.3 36.0 - 46.0 %   MCV 95.3 80.0 - 100.0 fL   MCH 30.3 26.0 - 34.0 pg   MCHC 31.9 30.0 - 36.0 g/dL   RDW 12.4 11.5 - 15.5 %   Platelets 210 150 - 400 K/uL   nRBC 0.0 0.0 - 0.2 %   Neutrophils Relative % 94 %   Neutro Abs 16.5 (H) 1.7 - 7.7 K/uL   Lymphocytes Relative 3 %   Lymphs Abs 0.6 (L) 0.7 - 4.0 K/uL   Monocytes Relative 2 %   Monocytes Absolute 0.3 0.1 - 1.0 K/uL   Eosinophils Relative 0 %   Eosinophils Absolute 0.1 0.0 - 0.5 K/uL   Basophils Relative 0 %   Basophils Absolute 0.0 0.0 - 0.1 K/uL   Immature Granulocytes 1 %   Abs Immature Granulocytes 0.17 (H) 0.00 - 0.07 K/uL    Comment: Performed at [facility] [address], [place], [place] [address]  Culture, blood (routine x 2)     Status: None (Preliminary result)   Collection Time: 09/07/21 10:10 PM   Specimen: BLOOD LEFT ARM  Result Value Ref Range   Specimen Description BLOOD LEFT ARM    Special Requests      BOTTLES DRAWN AEROBIC AND ANAEROBIC Blood Culture adequate volume   Culture      NO GROWTH 2 DAYS Performed at [facility], [place] [address], [place], [place] [address]    Report Status PENDING   Culture, blood (routine x 2)     Status: None (Preliminary result)   Collection Time: 09/07/21 10:30 PM   Specimen: BLOOD RIGHT ARM  Result Value Ref Range   Specimen Description BLOOD RIGHT ARM    Special Requests      BOTTLES DRAWN AEROBIC AND ANAEROBIC Blood Culture adequate volume   Culture      NO GROWTH 2 DAYS Performed at [facility], [place] [address], [place], [place] [address]    Report  Status PENDING   Resp Panel by RT-PCR (Flu A&B, Covid) Nasopharyngeal Swab     Status: Abnormal   Collection Time: 09/07/21 10:46 PM   Specimen: Nasopharyngeal Swab; Nasopharyngeal(NP) swabs in vial transport medium  Result Value Ref Range   SARS Coronavirus 2 by RT PCR POSITIVE (A) NEGATIVE  Comment: RESULT CALLED TO, READ BACK BY AND VERIFIED WITH: RN ROBERT S. 09/08/21@00 :06 BY TW (NOTE) SARS-CoV-2 target nucleic acids are DETECTED.  The SARS-CoV-2 RNA is generally detectable in upper respiratory specimens during the acute phase of infection. Positive results are indicative of the presence of the identified virus, but do not rule out bacterial infection or co-infection with other pathogens not detected by the test. Clinical correlation with patient history and other diagnostic information is necessary to determine patient infection status. The expected result is Negative.  Fact Sheet for Patients: EntrepreneurPulse.com.au  Fact Sheet for Healthcare Providers: IncredibleEmployment.be  This test is not yet approved or cleared by the [place] FDA and  has been authorized for detection and/or diagnosis of SARS-CoV-2 by FDA under an Emergency Use Authorization (EUA).  This EUA will remain in effect (meaning this test can be  used) for the duration of  the COVID-19 declaration under Section 564(b)(1) of the Act, 21 U.S.C. section 360bbb-3(b)(1), unless the authorization is terminated or revoked sooner.     Influenza A by PCR NEGATIVE NEGATIVE   Influenza B by PCR NEGATIVE NEGATIVE    Comment: (NOTE) The Xpert Xpress SARS-CoV-2/FLU/RSV plus assay is intended as an aid in the diagnosis of influenza from Nasopharyngeal swab specimens and should not be used as a sole basis for treatment. Nasal washings and aspirates are unacceptable for Xpert Xpress SARS-CoV-2/FLU/RSV testing.  Fact Sheet for  Patients: EntrepreneurPulse.com.au  Fact Sheet for Healthcare Providers: IncredibleEmployment.be  This test is not yet approved or cleared by the [place] FDA and has been authorized for detection and/or diagnosis of SARS-CoV-2 by FDA under an Emergency Use Authorization (EUA). This EUA will remain in effect (meaning this test can be used) for the duration of the COVID-19 declaration under Section 564(b)(1) of the Act, 21 U.S.C. section 360bbb-3(b)(1), unless the authorization is terminated or revoked.  Performed at [facility], [place] [address], [place], [place] [address]   CBC     Status: Abnormal   Collection Time: 09/08/21  4:48 AM  Result Value Ref Range   WBC 19.1 (H) 4.0 - 10.5 K/uL   RBC 4.05 3.87 - 5.11 MIL/uL   Hemoglobin 12.4 12.0 - 15.0 g/dL   HCT 39.0 36.0 - 46.0 %   MCV 96.3 80.0 - 100.0 fL   MCH 30.6 26.0 - 34.0 pg   MCHC 31.8 30.0 - 36.0 g/dL   RDW 12.4 11.5 - 15.5 %   Platelets 215 150 - 400 K/uL   nRBC 0.0 0.0 - 0.2 %    Comment: Performed at [facility], [place] [address] [place], [place] [address]  Comprehensive metabolic panel     Status: Abnormal   Collection Time: 09/08/21  4:48 AM  Result Value Ref Range   Sodium 137 135 - 145 mmol/L   Potassium 4.6 3.5 - 5.1 mmol/L   Chloride 104 98 - 111 mmol/L   CO2 25 22 - 32 mmol/L   Glucose, Bld 195 (H) 70 - 99 mg/dL    Comment: Glucose reference range applies only to samples taken after fasting for at least 8 hours.   BUN 17 8 - 23 mg/dL   Creatinine, Ser 0.82 0.44 - 1.00 mg/dL   Calcium 8.7 (L) 8.9 - 10.3 mg/dL   Total Protein 6.5 6.5 - 8.1 g/dL   Albumin 2.6 (L) 3.5 - 5.0 g/dL   AST 51 (H) 15 - 41 U/L   ALT 58 (H) 0 -  44 U/L   Alkaline Phosphatase 102 38 - 126 U/L   Total Bilirubin 0.8 0.3 - 1.2 mg/dL   GFR, Estimated >60 >60 mL/min    Comment: (NOTE) Calculated using the CKD-EPI Creatinine Equation (2021)    Anion gap 8 5 - 15    Comment:  Performed at [facility] [address] [place], [place] [address]  Aerobic/Anaerobic Culture w Gram Stain (surgical/deep wound)     Status: None (Preliminary result)   Collection Time: 09/08/21  7:28 PM   Specimen: Neck, Left; Abscess  Result Value Ref Range   Specimen Description ABSCESS    Special Requests LEFT NECK SPEC A    Gram Stain      FEW SQUAMOUS EPITHELIAL CELLS PRESENT FEW WBC PRESENT, PREDOMINANTLY MONONUCLEAR MODERATE GRAM POSITIVE COCCI FEW GRAM NEGATIVE RODS FEW GRAM POSITIVE RODS    Culture      NO GROWTH < 24 HOURS Performed at [facility], [place] [address] [place], [place] [address]    Report Status PENDING   Glucose, capillary     Status: Abnormal   Collection Time: 09/08/21  9:00 PM  Result Value Ref Range   Glucose-Capillary 227 (H) 70 - 99 mg/dL    Comment: Glucose reference range applies only to samples taken after fasting for at least 8 hours.  MRSA Next Gen by PCR, Nasal     Status: None   Collection Time: 09/08/21  9:09 PM   Specimen: Nasal Mucosa; Nasal Swab  Result Value Ref Range   MRSA by PCR Next Gen NOT DETECTED NOT DETECTED    Comment: (NOTE) The GeneXpert MRSA Assay (FDA approved for NASAL specimens only), is one component of a comprehensive MRSA colonization surveillance program. It is not intended to diagnose MRSA infection nor to guide or monitor treatment for MRSA infections. Test performance is not FDA approved in patients less than 46 years old. Performed at [facility], [place] [address], [place], [place] [address]   I-STAT 7, (LYTES, BLD GAS, ICA, H+H)     Status: Abnormal   Collection Time: 09/08/21 11:46 PM  Result Value Ref Range   pH, Arterial 7.422 7.350 - 7.450   pCO2 arterial 40.3 32.0 - 48.0 mmHg   pO2, Arterial 124 (H) 83.0 - 108.0 mmHg   Bicarbonate 26.3 20.0 - 28.0 mmol/L   TCO2 27 22 - 32 mmol/L   O2 Saturation 99.0 %   Acid-Base Excess 2.0 0.0 - 2.0 mmol/L   Sodium 140 135 - 145 mmol/L    Potassium 3.6 3.5 - 5.1 mmol/L   Calcium, Ion 1.12 (L) 1.15 - 1.40 mmol/L   HCT 32.0 (L) 36.0 - 46.0 %   Hemoglobin 10.9 (L) 12.0 - 15.0 g/dL   Patient temperature 98.2 F    Collection site Radial    Drawn by RT    Sample type ARTERIAL   Glucose, capillary     Status: Abnormal   Collection Time: 09/08/21 11:52 PM  Result Value Ref Range   Glucose-Capillary 238 (H) 70 - 99 mg/dL    Comment: Glucose reference range applies only to samples taken after fasting for at least 8 hours.  Glucose, capillary     Status: Abnormal   Collection Time: 09/09/21  4:00 AM  Result Value Ref Range   Glucose-Capillary 206 (H) 70 - 99 mg/dL    Comment: Glucose reference range applies only to samples taken after fasting for at least 8 hours.  CBC with  Differential/Platelet     Status: Abnormal   Collection Time: 09/09/21  6:26 AM  Result Value Ref Range   WBC 13.5 (H) 4.0 - 10.5 K/uL   RBC 3.92 3.87 - 5.11 MIL/uL   Hemoglobin 11.9 (L) 12.0 - 15.0 g/dL   HCT 37.0 36.0 - 46.0 %   MCV 94.4 80.0 - 100.0 fL   MCH 30.4 26.0 - 34.0 pg   MCHC 32.2 30.0 - 36.0 g/dL   RDW 12.6 11.5 - 15.5 %   Platelets 239 150 - 400 K/uL   nRBC 0.0 0.0 - 0.2 %   Neutrophils Relative % 91 %   Neutro Abs 12.3 (H) 1.7 - 7.7 K/uL   Lymphocytes Relative 4 %   Lymphs Abs 0.5 (L) 0.7 - 4.0 K/uL   Monocytes Relative 4 %   Monocytes Absolute 0.6 0.1 - 1.0 K/uL   Eosinophils Relative 0 %   Eosinophils Absolute 0.0 0.0 - 0.5 K/uL   Basophils Relative 0 %   Basophils Absolute 0.0 0.0 - 0.1 K/uL   Immature Granulocytes 1 %   Abs Immature Granulocytes 0.13 (H) 0.00 - 0.07 K/uL    Comment: Performed at [facility], [address] [place], [place] [address]  Basic metabolic panel     Status: Abnormal   Collection Time: 09/09/21  6:26 AM  Result Value Ref Range   Sodium 141 135 - 145 mmol/L   Potassium 3.8 3.5 - 5.1 mmol/L   Chloride 104 98 - 111 mmol/L   CO2 26 22 - 32 mmol/L   Glucose, Bld 220 (H) 70 - 99 mg/dL     Comment: Glucose reference range applies only to samples taken after fasting for at least 8 hours.   BUN 29 (H) 8 - 23 mg/dL   Creatinine, Ser 0.94 0.44 - 1.00 mg/dL   Calcium 8.3 (L) 8.9 - 10.3 mg/dL   GFR, Estimated >60 >60 mL/min    Comment: (NOTE) Calculated using the CKD-EPI Creatinine Equation (2021)    Anion gap 11 5 - 15    Comment: Performed at [facility] [address], [place], [place] [address]  Triglycerides     Status: Abnormal   Collection Time: 09/09/21  6:26 AM  Result Value Ref Range   Triglycerides 171 (H) <150 mg/dL    Comment: Performed at [facility] [address] [place], [place] [address]  Hemoglobin A1c     Status: Abnormal   Collection Time: 09/09/21  6:26 AM  Result Value Ref Range   Hgb A1c MFr Bld 6.8 (H) 4.8 - 5.6 %    Comment: (NOTE) Pre diabetes:          5.7%-6.4%  Diabetes:              >6.4%  Glycemic control for   <7.0% adults with diabetes    Mean Plasma Glucose 148.46 mg/dL    Comment: Performed at [facility] [address], [place], [place] [address]  Glucose, capillary     Status: Abnormal   Collection Time: 09/09/21  9:48 AM  Result Value Ref Range   Glucose-Capillary 203 (H) 70 - 99 mg/dL    Comment: Glucose reference range applies only to samples taken after fasting for at least 8 hours.  Glucose, capillary     Status: Abnormal   Collection Time: 09/09/21 12:21 PM  Result Value Ref Range   Glucose-Capillary 168 (H) 70 - 99 mg/dL  Comment: Glucose reference range applies only to samples taken after fasting for at least 8 hours.  Glucose, capillary     Status: Abnormal   Collection Time: 09/09/21  3:26 PM  Result Value Ref Range   Glucose-Capillary 138 (H) 70 - 99 mg/dL    Comment: Glucose reference range applies only to samples taken after fasting for at least 8 hours.    MICRO:  IMAGING: DG Orthopantogram  Result Date: 09/08/2021 CLINICAL DATA:  Swelling EXAM: ORTHOPANTOGRAM/PANORAMIC 1 view  COMPARISON:  None. FINDINGS: Dental amalgam. No periapical lucencies identified. The mandible demonstrates normal alignment without acute osseous abnormality. IMPRESSION: No acute abnormality. No radiographic findings of a periodontal abscess. Electronically Signed   By: Albin Felling M.D.   On: 09/08/2021 10:55   CT Soft Tissue Neck W Contrast  Result Date: 09/07/2021 CLINICAL DATA:  Throat swelling and dental disease. EXAM: CT NECK WITH CONTRAST TECHNIQUE: Multidetector CT imaging of the neck was performed using the standard protocol following the bolus administration of intravenous contrast. CONTRAST:  100m OMNIPAQUE IOHEXOL 350 MG/ML SOLN COMPARISON:  None. FINDINGS: Pharynx and larynx: There is a large abscess of sublingual space that measures 4.4 x 1.3 cm. There is multifocal internal gas. There is a component of the abscess extending into the left submandibular space. The retropharyngeal space is clear. The nasopharynx is normal. There is no airway stenosis. Normal epiglottis. Salivary glands: Negative Thyroid: Normal Lymph nodes: None enlarged or abnormal density. Vascular: Negative. Limited intracranial: Negative. Visualized orbits: Negative. Mastoids and visualized paranasal sinuses: Clear. Skeleton: No acute or aggressive process. Upper chest: Negative. Other: None. IMPRESSION: 1. Large abscess of the sublingual space with extension into the left submandibular space. 2. No airway stenosis. Electronically Signed   By: KUlyses JarredM.D.   On: 09/07/2021 22:36   DG CHEST PORT 1 VIEW  Result Date: 09/08/2021 CLINICAL DATA:  Intubated EXAM: PORTABLE CHEST 1 VIEW COMPARISON:  04/06/2021 FINDINGS: Single frontal view of the chest demonstrates an unremarkable cardiac silhouette. Endotracheal tube overlies tracheal air column tip at level of thoracic inlet. Linear consolidation at the left lung base likely reflects atelectasis. No airspace disease, effusion, or pneumothorax. IMPRESSION: 1. No  complications after intubation. 2. Likely hypoventilatory changes left lung base. Electronically Signed   By: MRanda NgoM.D.   On: 09/08/2021 21:39    HISTORICAL MICRO/IMAGING  Assessment/Plan:  89yoF with acute angioedema associated with odontogenic infection/submandibular abscess POD#1 s/p I X D  Submandibular abscess = will recommend to add clindamycin in addition to amp/sub for the time being. Will follow culture results to help narrow abtx choice  Leukocytosis = from infection/steroids, anticipate to trend down as infection is clearing and steroid tapers  Covid-19 = Will check with family if she was having symptoms to see if it would benefit to give any antivirals, given age and DM Risk factors

## 2021-09-09 NOTE — Consult Note (Signed)
NAME:  Shannon Stephenson, MRN:  [id number], DOB:  03/20/1941, LOS: 2 ADMISSION DATE:  09/07/2021, CONSULTATION DATE:  09/08/21 REFERRING MD:  Ross Marcus CHIEF COMPLAINT:  Oral pain   History of Present Illness:  Shannon Stephenson is a 80 y.o. female who has a PMH as below and who presented to [facility] ED 9/28 with oral pain and swelling of submandibular / sublingual space that started 3 - 4 days prior.  She went to the dentist the following day (Monday 9/26) and reportedly had tooth 18 removed.  Since then, swelling worsened to the point that she came to ED.  She was given steroids for initial presumption of angioedema; however, CT neck showed a submandibular abscess.  She was subsequently started on Unasyn and she was taken to the OR by dental surgery for I&D of bilateral sublingual abscess, bilateral submandibular abscess, submental abscess.   Per anesthesia, upon intubation (nasally) she had a swollen glottis.  She received 10mg  Decadron and was left intubated upon completion of the case.  She was incidentally found to be COVID positive and per chart review, has been asymptomatic.  Pertinent  Medical History:  has Spinal stenosis of lumbar region; Ludwig's angina; T2DM (type 2 diabetes mellitus) (HCC); HTN (hypertension); Mouth abscess; Endotracheal tube present; and Acute respiratory failure with hypoxia (HCC) on their problem list.  Significant Hospital Events: Including procedures, antibiotic start and stop dates in addition to other pertinent events   9/28 > admit, taken to OR for dental surgery (OR note pending).  Interim History / Subjective:  No overnight issues. Remains intubated and sedated.   Objective:  Blood pressure (!) 147/67, pulse 70, temperature 98.1 F (36.7 C), temperature source Oral, resp. rate 13, weight 64.3 kg, SpO2 99 %.    Vent Mode: PRVC FiO2 (%):  [30 %] 30 % Set Rate:  [12 bmp] 12 bmp Vt Set:  [450 mL] 450 mL Plateau Pressure:  [13 cmH20-15 cmH20] 13 cmH20    Intake/Output Summary (Last 24 hours) at 09/09/2021 0750 Last data filed at 09/09/2021 0600 Gross per 24 hour  Intake 1871.62 ml  Output 250 ml  Net 1621.62 ml   Filed Weights   09/08/21 2107  Weight: 64.3 kg    Examination: General: Adult female, resting in bed, in NAD. Neuro: Sedated, not responsive. HEENT: nasally intubated, significant neck edema with bandages in place, s/p dental extractions Cardiovascular: RRR, no M/R/G.  Lungs: non labored Abdomen: BS x 4, soft, NT/ND.  Musculoskeletal: no peripheral edema Skin: Intact, warm, no rashes.  Labs reviewed Na 141 K 3.8 Cl 104 Glucose 220  Labs/imaging personally reviewed:  CT soft tissue neck 9/28 > large abscess of sublingual space with extension into the left submandibular space.  Resolved Hospital Problem list:    Assessment & Plan:   Submandibular abscess - s/p OR for I&D of bilateral sublingual abscess, bilateral submandibular abscess, submental abscess.  - Continue empiric unasyn. - Follow cultures. - Post op care per oral surgery.  Acute hypoxic respiratory failure with swollen glottis - presumed 2/2 above.  Received 10mg  Decadron post intubation. - Continue full vent support. - Daily SBT. - Ensure good cuff leak prior to considering extubation. - Continue empiric decadron, 4mg  q6hrs x 2 days. - Bronchial hygiene. - Follow CXR.  COVID 19 infection - incidental finding.  Asymptomatic per report. - Supportive care.  Hx HTN. - Hold home Lisinopril, HCTZ. - continue Labetalol PRN and Hydralazine PRN for SBP > 180.  Hx DM (last  Hgb A1c 5.4 in April 2022).  - SSI for now in anticipation of worsening hyperglycemia after steroids. - will need to increase today to resistant scale.  Best practice (evaluated daily):  Diet/type: NPO DVT prophylaxis: SCD GI prophylaxis: PPI Lines: N/A Foley:  N/A Code Status:  full code Last date of multidisciplinary goals of care discussion: per primary  Labs    CBC: Recent Labs  Lab 09/07/21 1950 09/08/21 0448 09/08/21 2346 09/09/21 0626  WBC 17.7* 19.1*  --  13.5*  NEUTROABS 16.5*  --   --  12.3*  HGB 12.2 12.4 10.9* 11.9*  HCT 38.3 39.0 32.0* 37.0  MCV 95.3 96.3  --  94.4  PLT 210 215  --  239    Basic Metabolic Panel: Recent Labs  Lab 09/07/21 1950 09/08/21 0448 09/08/21 2346 09/09/21 0626  NA 136 137 140 141  K 3.8 4.6 3.6 3.8  CL 104 104  --  104  CO2 22 25  --  26  GLUCOSE 226* 195*  --  220*  BUN 17 17  --  29*  CREATININE 0.92 0.82  --  0.94  CALCIUM 8.6* 8.7*  --  8.3*   GFR: CrCl cannot be calculated (Unknown ideal weight.). Recent Labs  Lab 09/07/21 1950 09/08/21 0448 09/09/21 0626  WBC 17.7* 19.1* 13.5*    Liver Function Tests: Recent Labs  Lab 09/08/21 0448  AST 51*  ALT 58*  ALKPHOS 102  BILITOT 0.8  PROT 6.5  ALBUMIN 2.6*   No results for input(s): LIPASE, AMYLASE in the last 168 hours. No results for input(s): AMMONIA in the last 168 hours.  ABG    Component Value Date/Time   PHART 7.422 09/08/2021 2346   PCO2ART 40.3 09/08/2021 2346   PO2ART 124 (H) 09/08/2021 2346   HCO3 26.3 09/08/2021 2346   TCO2 27 09/08/2021 2346   O2SAT 99.0 09/08/2021 2346     Coagulation Profile: No results for input(s): INR, PROTIME in the last 168 hours.  Cardiac Enzymes: No results for input(s): CKTOTAL, CKMB, CKMBINDEX, TROPONINI in the last 168 hours.  HbA1C: Hgb A1c MFr Bld  Date/Time Value Ref Range Status  09/09/2021 06:26 AM 6.8 (H) 4.8 - 5.6 % Final    Comment:    (NOTE) Pre diabetes:          5.7%-6.4%  Diabetes:              >6.4%  Glycemic control for   <7.0% adults with diabetes     CBG: Recent Labs  Lab 09/08/21 2100 09/08/21 2352 09/09/21 0400  GLUCAP 227* 238* 206*     Critical care time: 32 min.   The patient is critically ill due to respiratory failure, airway compromise.  Critical care was necessary to treat or prevent imminent or life-threatening deterioration.   Critical care was time spent personally by me on the following activities: development of treatment plan with patient and/or surrogate as well as nursing, discussions with consultants, evaluation of patient's response to treatment, examination of patient, obtaining history from patient or surrogate, ordering and performing treatments and interventions, ordering and review of laboratory studies, ordering and review of radiographic studies, pulse oximetry, re-evaluation of patient's condition and participation in multidisciplinary rounds.   Critical Care Time devoted to patient care services described in this note is 32 minutes. This time reflects time of care of this signee Charlott Holler . This critical care time does not reflect separately billable procedures or procedure time, teaching  time or supervisory time of PA/NP/Med student/Med Resident etc but could involve care discussion time.       Charlott Holler [facility] 09/09/2021 7:51 AM  Pager: see AMION  If no response to pager , please call critical care on call (see AMION) until 7pm After 7:00 pm call Elink

## 2021-09-09 NOTE — Progress Notes (Signed)
Initial Nutrition Assessment  DOCUMENTATION CODES:   Not applicable  INTERVENTION:   Recommend obtaining enteral access if appropriate and initiating enteral nutrition. Recommend: - Vital AF 1.2 @ 50 ml/hr (1200 ml/day)  Recommended tube feeding regimen would provide 1440 kcal, 90 grams of protein, and 973 ml of H2O.   NUTRITION DIAGNOSIS:   Inadequate oral intake related to inability to eat as evidenced by NPO status.  GOAL:   Patient will meet greater than or equal to 90% of their needs  MONITOR:   Vent status, Labs, Weight trends, Skin  REASON FOR ASSESSMENT:   Ventilator    ASSESSMENT:   80 year old female who presented to the ED on 9/27 with tongue/throat swelling. PMH of T2DM, HTN, recent dental procedure with sublingual and L submandibular abscess. Pt admitted with submandibular abscess, incidental asymptomatic COVID-19 infection.  9/28 - s/p I&D of sublingual, submandibular, and submental abscesses  Discussed pt with RN and during ICU rounds. Pt remains nasally intubated s/p surgery yesterday. No enteral access at this time.  Unable to obtain diet and weight history at this time. Reviewed weight history in chart. Last available weight is from 03/01/17 when pt weighed 72.1. Pt with weight loss since that date but unable to determine how quickly weight loss occurred.  Patient is currently nasally intubated on ventilator support MV: 5.5 L/min Temp (24hrs), Avg:98.2 F (36.8 C), Min:97.8 F (36.6 C), Max:98.4 F (36.9 C) BP (cuff): 98/46 MAP (cuff): 63  Drips: Propofol: 15.4 ml/hr (provides 407 kcal daily from lipid)  Medications reviewed and include: IV decadron, SSI q 4 hours, IV protonix, IV abx  Labs reviewed: BUN 29, ionized calcium 1.12, elevated LFTs, TG 171, hemoglobin A1C 6.8 CBG's: 138-238 x 24 hours  UOP: 150 ml x 12 hours I/O's: +2.6 L since admit  NUTRITION - FOCUSED PHYSICAL EXAM:  Unable to complete at this time.  Diet Order:   Diet  Order     None       EDUCATION NEEDS:   No education needs have been identified at this time  Skin:  Skin Assessment: Skin Integrity Issues: Incisions: neck  Last BM:  no documented BM  Height:   Ht Readings from Last 1 Encounters:  09/09/21 5\' 1"  (1.549 m)    Weight:   Wt Readings from Last 1 Encounters:  09/08/21 64.3 kg    BMI:  Body mass index is 26.78 kg/m.  Estimated Nutritional Needs:   Kcal:  1300-1500  Protein:  75-90 grams  Fluid:  >/= 1.5 L    09/10/21, MS, RD, LDN Inpatient Clinical Dietitian Please see AMiON for contact information.

## 2021-09-09 NOTE — Plan of Care (Addendum)

## 2021-09-09 NOTE — Progress Notes (Signed)
Progress Note  ID: 55 yoF w/ DM2 with a bilateral submandibular, sublingual, and submental space infection consistent with Ludwig's angina s/p extraoral I&D on 09/08/21.  Interval History 9/29 - NAEON. Patient stable and intubated, lightly sedated. WBC down to 13. Clinical exam improved compared to pre-op status.     Clinical Exam: Extraoral Exam:              Patient is intubated, lightly sedated, responsive to verbal command; nasal ETT in place with no alar pressure             Incisions/drains are intact with slight SS drainage  Intraoral Exam:              The patient does not have trismus with maximum incisal opening of 50 mm.             The buccal vestibules are not raised.             The floor of mouth is indurated and elevated bilaterally, however this is improved from pre-op              The tongue is less elevated.             There is not lateral pharyngeal swelling with the uvula midline             There is no palatal draping present.             Oral airway is patent                Assessment: 32 yoF w/ DM2 with a bilateral submandibular, sublingual, and submental space infection consistent with Ludwig's angina s/p extraoral I&D on 09/08/21. Overall patient is doing well and making good clinical improvement.   Plan:    OMFS Recommendations -Leave patient intubated in ICU at minimum of 24 hours to allow time to assess response to surgery, may consider extubation on 9/30  -- If unable to extubate on 9/30 (defer to ICU team) okay to begin nutrition -Recommend decadron q8hr  -Continue Unasyn/Clindamycin  -Would anticipate patient to be inpatient for 4 days pending clinical course -Liquid diet once extubated and will advance to soft mechanical as patient tolerates -Peridex (chlorhexidine) mouthrnise QID -Change Kerlix dressing as needed -Follow cultures -Daily CBC w/ diff   Shannon Stephenson, DDS Oral and Maxillofacial Surgeon [facility]  [place]) Office # [phone] Cell # [phone]

## 2021-09-10 DIAGNOSIS — U071 COVID-19: Secondary | ICD-10-CM

## 2021-09-10 DIAGNOSIS — J988 Other specified respiratory disorders: Secondary | ICD-10-CM

## 2021-09-10 DIAGNOSIS — K122 Cellulitis and abscess of mouth: Secondary | ICD-10-CM | POA: Diagnosis not present

## 2021-09-10 LAB — GLUCOSE, CAPILLARY
Glucose-Capillary: 128 mg/dL — ABNORMAL HIGH (ref 70–99)
Glucose-Capillary: 133 mg/dL — ABNORMAL HIGH (ref 70–99)
Glucose-Capillary: 135 mg/dL — ABNORMAL HIGH (ref 70–99)
Glucose-Capillary: 140 mg/dL — ABNORMAL HIGH (ref 70–99)
Glucose-Capillary: 152 mg/dL — ABNORMAL HIGH (ref 70–99)
Glucose-Capillary: 155 mg/dL — ABNORMAL HIGH (ref 70–99)
Glucose-Capillary: 162 mg/dL — ABNORMAL HIGH (ref 70–99)

## 2021-09-10 MED ORDER — FENTANYL CITRATE (PF) 100 MCG/2ML IJ SOLN
25.0000 ug | INTRAMUSCULAR | Status: DC | PRN
  Administered 2021-09-10 – 2021-09-13 (×5): 25 ug via INTRAVENOUS
  Filled 2021-09-10 (×5): qty 2

## 2021-09-10 NOTE — Procedures (Addendum)
Extubation Procedure Note  Patient Details:   Name: VICCI REDER DOB: 1941/08/25 MRN: [id number]   Airway Documentation:    Vent end date: 09/10/21 Vent end time: 0950   Evaluation  O2 sats: stable throughout Complications: No apparent complications Patient did tolerate procedure well. Bilateral Breath Sounds: Diminished   Yes  Pt extubated to 3L [place]. Cuff leak present, No stridor noted, RN at beside, vitals stable, RT will continue to monitor.  Rosalita Levan 09/10/2021, 10:46 AM

## 2021-09-10 NOTE — Progress Notes (Signed)
NAME:  Shannon Stephenson, MRN:  [id number], DOB:  1941-01-02, LOS: 3 ADMISSION DATE:  09/07/2021, CONSULTATION DATE:  09/08/21 REFERRING MD:  Ross Marcus CHIEF COMPLAINT:  Oral pain   History of Present Illness:  80 yo female presented with 3 days of progressive oral pain, swelling of submandibular/sublingual space.  Reported to have undergone extraction of 18 teeth on 09/06/21.  Started on steroids and antibiotics.  Taken to OR by dental surgery for I&D of b/l sublingual, submandibular and submental abscesses.  Nasally intubated for airway protection.  Has incidental COVID positive.  Pertinent  Medical History:  Spinal stenosis, DM type 2, HTN  Significant Hospital Events: Including procedures, antibiotic start and stop dates in addition to other pertinent events   9/28 admit, taken to OR for dental surgery 9/30 start vent weaning  Interim History / Subjective:  Remains on sedation, vent support  Objective:  Blood pressure (!) 173/68, pulse 65, temperature 97.7 F (36.5 C), temperature source Axillary, resp. rate 12, height 5\' 1"  (1.549 m), weight 64.3 kg, SpO2 99 %.    Vent Mode: PRVC FiO2 (%):  [30 %] 30 % Set Rate:  [12 bmp] 12 bmp Vt Set:  [450 mL] 450 mL PEEP:  [5 cmH20] 5 cmH20 Plateau Pressure:  [12 cmH20-14 cmH20] 12 cmH20   Intake/Output Summary (Last 24 hours) at 09/10/2021 0933 Last data filed at 09/10/2021 09/12/2021 Gross per 24 hour  Intake 1910.82 ml  Output 1475 ml  Net 435.82 ml   Filed Weights   09/08/21 2107  Weight: 64.3 kg    Examination:  General - sedated Eyes - pupils reactive ENT - nasally intubated, wound dressing over neck clean Cardiac - regular rate/rhythm, no murmur Chest - equal breath sounds b/l, no wheezing or rales Abdomen - soft, non tender, + bowel sounds Extremities - no cyanosis, clubbing, or edema Skin - no rashes Neuro - RASS -1   Resolved Hospital Problem list:    Assessment & Plan:   Sublingual, submandibular and submental  abscesses s/p I&D. - day 4 of unasyn, day 2 of clindamycin per ID - post op care per dental surgery - f/u blood culture from 9/27, wound culture from 9/28  Compromised airway. - extubation trial on 9/30 - f/u CXR as needed - day 2 of decadron  Incidental COVID 19 positive. - supportive care  Hx HTN. - goal SBP < 160 - hold outpt HCTZ  DM type 2 poorly controlled with steroid induced hyperglycemia. - SSI  Hx of osteoporosis. - hold outpt fosamax  Best practice (evaluated daily):  Diet/type: NPO DVT prophylaxis: SCD GI prophylaxis: PPI Lines: N/A Foley:  N/A Code Status:  full code Last date of multidisciplinary goals of care discussion: per primary  Labs    CMP Latest Ref Rng & Units 09/09/2021 09/08/2021 09/08/2021  Glucose 70 - 99 mg/dL 09/10/2021) - 520(E)  BUN 8 - 23 mg/dL 022(V) - 17  Creatinine 0.44 - 1.00 mg/dL 36(P - 2.24  Sodium 4.97 - 145 mmol/L 141 140 137  Potassium 3.5 - 5.1 mmol/L 3.8 3.6 4.6  Chloride 98 - 111 mmol/L 104 - 104  CO2 22 - 32 mmol/L 26 - 25  Calcium 8.9 - 10.3 mg/dL 8.3(L) - 8.7(L)  Total Protein 6.5 - 8.1 g/dL - - 6.5  Total Bilirubin 0.3 - 1.2 mg/dL - - 0.8  Alkaline Phos 38 - 126 U/L - - 102  AST 15 - 41 U/L - - 51(H)  ALT 0 - 44 U/L - -  58(H)    CBC Latest Ref Rng & Units 09/09/2021 09/08/2021 09/08/2021  WBC 4.0 - 10.5 K/uL 13.5(H) - 19.1(H)  Hemoglobin 12.0 - 15.0 g/dL 11.9(L) 10.9(L) 12.4  Hematocrit 36.0 - 46.0 % 37.0 32.0(L) 39.0  Platelets 150 - 400 K/uL 239 - 215    ABG    Component Value Date/Time   PHART 7.422 09/08/2021 2346   PCO2ART 40.3 09/08/2021 2346   PO2ART 124 (H) 09/08/2021 2346   HCO3 26.3 09/08/2021 2346   TCO2 27 09/08/2021 2346   O2SAT 99.0 09/08/2021 2346    CBG (last 3)  Recent Labs    09/10/21 0010 09/10/21 0446 09/10/21 0833  GLUCAP 135* 162* 140*    Critical care time: 35 minutes  Coralyn Helling, MD [facility] Pager - [phone] 09/10/2021, 9:45 AM

## 2021-09-10 NOTE — Progress Notes (Signed)
Assisted tele visit to patient with 3 family members.  Vena Austria, RN

## 2021-09-10 NOTE — Progress Notes (Signed)
Progress Note  ID: 34 yoF w/ DM2 with a bilateral submandibular, sublingual, and submental space infection consistent with Ludwig's angina s/p extraoral I&D on 09/08/21.  Interval History 9/29 - NAEON. Patient stable and intubated, lightly sedated. WBC down to 13. Clinical exam improved compared to pre-op status.  9/30 - NAEON. Pt on vent, stable. Labs not drawn today. BS elevated likely 2/2 steroids. Overall clinical picture much improved. Only slight drainage from penrose drains.    Clinical Exam: Extraoral Exam:              Patient is intubated, lightly sedated, responsive to verbal command; nasal ETT in place with no alar pressure             Incisions/drains are intact with slight SS drainage  Intraoral Exam:              The patient does not have trismus with maximum incisal opening of 50 mm.             The buccal vestibules are not raised.             The floor of mouth is less indurated and elevated bilaterally             The tongue is less elevated.             There is not lateral pharyngeal swelling with the uvula midline             There is no palatal draping present.             Oral airway is patent                Assessment: 56 yoF w/ DM2 with a bilateral submandibular, sublingual, and submental space infection consistent with Ludwig's angina s/p extraoral I&D on 09/08/21. Overall patient is doing well and continuing to make good clinical improvement.   Plan:    OMFS Recommendations -Okay to extubate from OMFS perspective if deemed appropriate from ICU standpoint (ie cuff leak, etc.); infection is improving and the tongue and FOM are much less edematous  -- If unable to extubate on 9/30 (defer to ICU team) okay to begin nutrition -Taper decadron -Continue Unasyn/Clindamycin -Would anticipate patient to be inpatient for 4 days pending clinical course -Liquid diet once extubated and will advance to soft mechanical as patient tolerates -Peridex (chlorhexidine)  mouthrnise QID -Change Kerlix dressing as needed -Follow cultures -Daily CBC w/ diff   Herbie Saxon, DDS Oral and Maxillofacial Surgeon [facility] [place]) Office # [phone] Cell # [phone]

## 2021-09-10 NOTE — Progress Notes (Signed)
[facility] for Infectious Disease    Date of Admission:  09/07/2021   Total days of abtx: 4   ID: Shannon Stephenson is a 80 y.o. female with  PMHX of HTN< DM2 with a bilateral submandibular, sublingual, and submental space infection consistent with Ludwig's angina s/p extraoral I&D on 09/08/21 Principal Problem:   Ludwig's angina Active Problems:   T2DM (type 2 diabetes mellitus) ([place])   HTN (hypertension)   Mouth abscess   Endotracheal tube present   Acute respiratory failure with hypoxia (HCC)    Subjective: Afebrile, extubated this morning, less swelling to face and neck. She reports only having 1 tooth extraction on the right side just prior to admission. She states that she does not having any cough or fever but she was tested Friday/Saturday (roughly 6-7 days ago) since close contact in family. Discussed with video vietnamese translator  Medications:   chlorhexidine gluconate (MEDLINE KIT)  15 mL Mouth Rinse BID   Chlorhexidine Gluconate Cloth  6 each Topical Daily   dexamethasone (DECADRON) injection  4 mg Intravenous Q6H   heparin injection (subcutaneous)  5,000 Units Subcutaneous Q8H   insulin aspart  0-20 Units Subcutaneous Q4H   mouth rinse  15 mL Mouth Rinse 10 times per day    Objective: Vital signs in last 24 hours: Temp:  [97.7 F (36.5 C)-98.3 F (36.8 C)] 98.1 F (36.7 C) (09/30 1119) Pulse Rate:  [52-167] 68 (09/30 1400) Resp:  [12-25] 18 (09/30 1400) BP: (94-195)/(48-123) 169/69 (09/30 1400) SpO2:  [62 %-100 %] 99 % (09/30 1400) FiO2 (%):  [30 %] 30 % (09/30 0940) Physical Exam  Constitutional:  oriented to person, place, and time. appears well-developed and well-nourished. No distress.  HENT: [place]/AT, PERRLA, no scleral icterus Mouth/Throat: Oropharynx is clear and dry. No oropharyngeal exudate. Bloody gums and lips are dry Cardiovascular: Normal rate, regular rhythm and normal heart sounds. Exam reveals no gallop and no friction rub.  No murmur  heard.  Pulmonary/Chest: Effort normal and breath sounds normal. No respiratory distress.  has no wheezes.  Neck = supple, no nuchal rigidity.bilateral penrose drains in place. Some serous drainage Abdominal: Soft. Bowel sounds are normal.  exhibits no distension. There is no tenderness.  Lymphadenopathy: no cervical adenopathy. No axillary adenopathy Neurological: alert and oriented to person, place, and time.  Skin: Skin is warm and dry. No rash noted. No erythema.  Psychiatric: a normal mood and affect.  behavior is normal.   Lab Results Recent Labs    09/08/21 0448 09/08/21 2346 09/09/21 0626  WBC 19.1*  --  13.5*  HGB 12.4 10.9* 11.9*  HCT 39.0 32.0* 37.0  NA 137 140 141  K 4.6 3.6 3.8  CL 104  --  104  CO2 25  --  26  BUN 17  --  29*  CREATININE 0.82  --  0.94   Liver Panel Recent Labs    09/08/21 0448  PROT 6.5  ALBUMIN 2.6*  AST 51*  ALT 58*  ALKPHOS 102  BILITOT 0.8   Sedimentation Rate No results for input(s): ESRSEDRATE in the last 72 hours. C-Reactive Protein No results for input(s): CRP in the last 72 hours.  Microbiology: Mixed mouth flora (including alpha strep, coag negative strep) - micro is doing subspeciation Studies/Results: DG CHEST PORT 1 VIEW  Result Date: 09/08/2021 CLINICAL DATA:  Intubated EXAM: PORTABLE CHEST 1 VIEW COMPARISON:  04/06/2021 FINDINGS: Single frontal view of the chest demonstrates an unremarkable cardiac silhouette. Endotracheal  tube overlies tracheal air column tip at level of thoracic inlet. Linear consolidation at the left lung base likely reflects atelectasis. No airspace disease, effusion, or pneumothorax. IMPRESSION: 1. No complications after intubation. 2. Likely hypoventilatory changes left lung base. Electronically Signed   By: Randa Ngo M.D.   On: 09/08/2021 21:39     Assessment/Plan: w/ DM2 with a bilateral submandibular, sublingual, and submental space infection consistent with Ludwig's angina s/p extraoral  I&D on 09/08/21, mixed oral flora thus far identified. Subplating started today in micro lab.  Submandibular infection/ondotigenic origin with associated tissue swelling/angioedema = continue on amp/sub with clindamycin. Continue on steroid taper. Would recommend to stop clindamycin Saturday afternoon but continue on amb/sub while hospitalized. Can discharge on amox/clav  Will have dr comer see this weekend and follow up on cultures  Covid-19 infection = she reports receiving 3 doses of covid vaccine. Recent infection. Recommend to still keep on isolation. If family can show [place] + test, then can consider d/c after 10 days since positive test. For now she is on day 3 of 10 of isolation per our testing.  Leukocytosis = improving  [facility] for Infectious Diseases Pager: [phone]  09/10/2021, 4:17 PM

## 2021-09-10 NOTE — Evaluation (Addendum)
Clinical/Bedside Swallow Evaluation Patient Details  Name: Shannon Stephenson MRN: [id number] Date of Birth: 12-17-1940  Today's Date: 09/10/2021 Time: SLP Start Time (ACUTE ONLY): 1430 SLP Stop Time (ACUTE ONLY): 1440 SLP Time Calculation (min) (ACUTE ONLY): 10 min  Past Medical History:  Past Medical History:  Diagnosis Date   Back pain    Diabetes mellitus without complication (HCC)    Hypertension    Past Surgical History:  Past Surgical History:  Procedure Laterality Date   INCISION AND DRAINAGE ABSCESS N/A 09/08/2021   Procedure: INCISION AND DRAINAGE NECK ABSCESS;  Surgeon: Enis Slipper, DMD;  Location: [facility];  Service: Oral Surgery;  Laterality: N/A;   LUMBAR LAMINECTOMY/DECOMPRESSION MICRODISCECTOMY Bilateral 10/09/2013   Procedure: MICRO LUMBAR DECOMPRESSION L3-L5 (2 LEVELS);  Surgeon: Javier Docker, MD;  Location: [facility];  Service: Orthopedics;  Laterality: Bilateral;   NO PAST SURGERIES     HPI:  80 yo female presented with 3 days of progressive oral pain, swelling of submandibular/sublingual space.  Reported to have undergone extraction of 18 teeth on 09/06/21.  Started on steroids and antibiotics.  Taken to OR by dental surgery for I&D of b/l sublingual, submandibular and submental abscesses.  Nasally intubated for airway protection 9/28-9/30.  Has incidental COVID positive. Cleared for liquid diet per oral surgeon.   Assessment / Plan / Recommendation  Clinical Impression  Pt demonstrates clear vocal quality, adequate toelrance of sips of thin liquids after three day intubatation following oral surgery. Did not trial solids given instruction for clear liquids. Will f/u x1 for tolerance and readiness to advance to solids. SLP Visit Diagnosis: Dysphagia, unspecified (R13.10)    Aspiration Risk  Mild aspiration risk    Diet Recommendation Thin liquid   Liquid Administration via: Cup;Straw    Other  Recommendations      Recommendations for follow up therapy are  one component of a multi-disciplinary discharge planning process, led by the attending physician.  Recommendations may be updated based on patient status, additional functional criteria and insurance authorization.  Follow up Recommendations None      Frequency and Duration min 1 x/week  1 week       Prognosis        Swallow Study   General HPI: 80 yo female presented with 3 days of progressive oral pain, swelling of submandibular/sublingual space.  Reported to have undergone extraction of 18 teeth on 09/06/21.  Started on steroids and antibiotics.  Taken to OR by dental surgery for I&D of b/l sublingual, submandibular and submental abscesses.  Nasally intubated for airway protection 9/28-9/30.  Has incidental COVID positive. Cleared for liquid diet per dentist Type of Study: Bedside Swallow Evaluation Diet Prior to this Study: NPO Temperature Spikes Noted: No Respiratory Status: Nasal cannula History of Recent Intubation: Yes Length of Intubations (days): 3 days Date extubated: 09/10/21 Behavior/Cognition: Alert;Cooperative Self-Feeding Abilities: Able to feed self Patient Positioning: Upright in bed Baseline Vocal Quality: Normal Volitional Cough: Strong Volitional Swallow: Able to elicit    Oral/Motor/Sensory Function Overall Oral Motor/Sensory Function: Within functional limits   Ice Chips     Thin Liquid Thin Liquid: Within functional limits Presentation: Straw;Cup    Nectar Thick Nectar Thick Liquid: Not tested   Honey Thick Honey Thick Liquid: Not tested   Puree Puree: Not tested   Solid     Solid: Not tested      Shantoria Ellwood, Riley Nearing 09/10/2021,2:52 PM

## 2021-09-11 DIAGNOSIS — K122 Cellulitis and abscess of mouth: Secondary | ICD-10-CM | POA: Diagnosis not present

## 2021-09-11 DIAGNOSIS — J9601 Acute respiratory failure with hypoxia: Secondary | ICD-10-CM | POA: Diagnosis not present

## 2021-09-11 LAB — GLUCOSE, CAPILLARY
Glucose-Capillary: 103 mg/dL — ABNORMAL HIGH (ref 70–99)
Glucose-Capillary: 105 mg/dL — ABNORMAL HIGH (ref 70–99)
Glucose-Capillary: 161 mg/dL — ABNORMAL HIGH (ref 70–99)
Glucose-Capillary: 77 mg/dL (ref 70–99)
Glucose-Capillary: 78 mg/dL (ref 70–99)
Glucose-Capillary: 78 mg/dL (ref 70–99)

## 2021-09-11 LAB — CBC
HCT: 31.8 % — ABNORMAL LOW (ref 36.0–46.0)
Hemoglobin: 10.4 g/dL — ABNORMAL LOW (ref 12.0–15.0)
MCH: 30.1 pg (ref 26.0–34.0)
MCHC: 32.7 g/dL (ref 30.0–36.0)
MCV: 91.9 fL (ref 80.0–100.0)
Platelets: 244 10*3/uL (ref 150–400)
RBC: 3.46 MIL/uL — ABNORMAL LOW (ref 3.87–5.11)
RDW: 12.4 % (ref 11.5–15.5)
WBC: 11.6 10*3/uL — ABNORMAL HIGH (ref 4.0–10.5)
nRBC: 0 % (ref 0.0–0.2)

## 2021-09-11 LAB — BASIC METABOLIC PANEL
Anion gap: 7 (ref 5–15)
BUN: 28 mg/dL — ABNORMAL HIGH (ref 8–23)
CO2: 27 mmol/L (ref 22–32)
Calcium: 7.9 mg/dL — ABNORMAL LOW (ref 8.9–10.3)
Chloride: 105 mmol/L (ref 98–111)
Creatinine, Ser: 0.81 mg/dL (ref 0.44–1.00)
GFR, Estimated: >60 mL/min (ref >60)
Glucose, Bld: 73 mg/dL (ref 70–99)
Potassium: 3.7 mmol/L (ref 3.5–5.1)
Sodium: 139 mmol/L (ref 135–145)

## 2021-09-11 MED ORDER — DOCUSATE SODIUM 100 MG PO CAPS
100.0000 mg | ORAL_CAPSULE | Freq: Two times a day (BID) | ORAL | Status: DC
  Administered 2021-09-11 – 2021-09-12 (×2): 100 mg via ORAL
  Filled 2021-09-11 (×3): qty 1

## 2021-09-11 MED ORDER — POLYETHYLENE GLYCOL 3350 17 G PO PACK
17.0000 g | PACK | Freq: Every day | ORAL | Status: DC
  Administered 2021-09-11 – 2021-09-12 (×2): 17 g via ORAL
  Filled 2021-09-11 (×2): qty 1

## 2021-09-11 MED ORDER — SENNA 8.6 MG PO TABS
1.0000 | ORAL_TABLET | Freq: Every day | ORAL | Status: DC
  Administered 2021-09-11 – 2021-09-12 (×2): 8.6 mg via ORAL
  Filled 2021-09-11 (×2): qty 1

## 2021-09-11 MED ORDER — POTASSIUM CHLORIDE 10 MEQ/100ML IV SOLN
10.0000 meq | INTRAVENOUS | Status: AC
  Administered 2021-09-11 (×3): 10 meq via INTRAVENOUS
  Filled 2021-09-11 (×3): qty 100

## 2021-09-11 NOTE — Evaluation (Signed)
Clinical/Bedside Swallow Evaluation Patient Details  Name: Shannon Stephenson MRN: [id number] Date of Birth: Dec 12, 1941  Today's Date: 09/11/2021 Time: SLP Start Time (ACUTE ONLY): 1415 SLP Stop Time (ACUTE ONLY): 1430 SLP Time Calculation (min) (ACUTE ONLY): 15 min  Past Medical History:  Past Medical History:  Diagnosis Date   Back pain    Diabetes mellitus without complication (HCC)    Hypertension    Past Surgical History:  Past Surgical History:  Procedure Laterality Date   INCISION AND DRAINAGE ABSCESS N/A 09/08/2021   Procedure: INCISION AND DRAINAGE NECK ABSCESS;  Surgeon: Enis Slipper, DMD;  Location: [facility];  Service: Oral Surgery;  Laterality: N/A;   LUMBAR LAMINECTOMY/DECOMPRESSION MICRODISCECTOMY Bilateral 10/09/2013   Procedure: MICRO LUMBAR DECOMPRESSION L3-L5 (2 LEVELS);  Surgeon: Javier Docker, MD;  Location: [facility];  Service: Orthopedics;  Laterality: Bilateral;   NO PAST SURGERIES     HPI:  80 yo female presented with 3 days of progressive oral pain, swelling of submandibular/sublingual space.  Reported to have undergone extraction of 18 teeth on 09/06/21.  Started on steroids and antibiotics.  Taken to OR by dental surgery for I&D of b/l sublingual, submandibular and submental abscesses.  Nasally intubated for airway protection 9/28-9/30.  She has drina in place.   Was found to incidentally to be COVID positive. Cleared for liquid diet per dentist yesterday. Per RN she was cleared for solids.    Assessment / Plan / Recommendation  Clinical Impression  ST follow up to assess for readiness for diet advancement to solid material. Per RN she is cleared for solid material.  Interpreter [phone] via Lorn Junes was utilized in [place] for this visit.  The patient reported no issues with intake of clear liquid diet.  She was observed this date with thin liquids via straw, pureed material and dry solids. Her oral and pharyngeal swallow appeared to be functional.  No overt s/s of  aspiration were seen.  She had good oral clearance of dry solids despite recent dental surgery.  Masitcation time appeared adequate.  Recommend advance diet to regular solids with thin liquids as indicated per the medical team.  ST will follow briefly for therapeutic diet tolerance. SLP Visit Diagnosis: Dysphagia, unspecified (R13.10)    Aspiration Risk  No limitations    Diet Recommendation   Regular with thin liquids  Medication Administration: Whole meds with liquid    Other  Recommendations Oral Care Recommendations: Oral care BID    Recommendations for follow up therapy are one component of a multi-disciplinary discharge planning process, led by the attending physician.  Recommendations may be updated based on patient status, additional functional criteria and insurance authorization.  Follow up Recommendations None      Frequency and Duration min 1 x/week  1 week       Prognosis   Good     Swallow Study   General Date of Onset: 09/08/21 HPI: 80 yo female presented with 3 days of progressive oral pain, swelling of submandibular/sublingual space.  Reported to have undergone extraction of 18 teeth on 09/06/21.  Started on steroids and antibiotics.  Taken to OR by dental surgery for I&D of b/l sublingual, submandibular and submental abscesses.  Nasally intubated for airway protection 9/28-9/30.  She has drina in place.   Was found to incidentally to be COVID positive. Cleared for liquid diet per dentist yesterday. Per RN she was cleared for solids. Type of Study: Bedside Swallow Evaluation Previous Swallow Assessment: Yesterday - cleared to clear  liquid diet. Diet Prior to this Study: Thin liquids Temperature Spikes Noted: No Respiratory Status: Nasal cannula History of Recent Intubation: Yes Length of Intubations (days): 3 days Date extubated: 09/10/21 Behavior/Cognition: Alert;Cooperative;Pleasant mood Oral Cavity Assessment: Within Functional Limits Oral Care Completed by  SLP: No Vision: Functional for self-feeding Self-Feeding Abilities: Able to feed self Patient Positioning: Upright in bed Baseline Vocal Quality: Normal Volitional Swallow: Able to elicit    Oral/Motor/Sensory Function     Ice Chips Ice chips: Not tested   Thin Liquid Thin Liquid: Within functional limits Presentation: Straw;Self Fed    Nectar Thick Nectar Thick Liquid: Not tested   Honey Thick Honey Thick Liquid: Not tested   Puree Puree: Within functional limits Presentation: Spoon   Solid     Solid: Within functional limits Presentation: Self Fed     Dimas Aguas, MA, CCC-SLP Acute Rehab SLP [phone]  Fleet Contras 09/11/2021,2:50 PM

## 2021-09-11 NOTE — Progress Notes (Addendum)
NAME:  Shannon Stephenson, MRN:  [id number], DOB:  31-Jan-1941, LOS: 4 ADMISSION DATE:  09/07/2021, CONSULTATION DATE:  09/08/21 REFERRING MD:  Ross Marcus CHIEF COMPLAINT:  Oral pain   History of Present Illness:  80 yo female presented with 3 days of progressive oral pain, swelling of submandibular/sublingual space.  Reported to have undergone dental extraction on 09/06/21.  Started on steroids and antibiotics.  Taken to OR by dental surgery for I&D of b/l sublingual, submandibular and submental abscesses.  Nasally intubated for airway protection.  Has incidental COVID positive.  Pertinent  Medical History:  Spinal stenosis, DM type 2, HTN  Significant Hospital Events: Including procedures, antibiotic start and stop dates in addition to other pertinent events   9/28 admit, taken to OR for dental surgery 9/30 extubated   Interim History / Subjective:  No respiratory issues after extubation.  Objective:  Blood pressure (!) 105/43, pulse 77, temperature 99 F (37.2 C), temperature source Oral, resp. rate 10, height 5\' 1"  (1.549 m), weight 64.3 kg, SpO2 99 %.    Vent Mode: PSV;CPAP FiO2 (%):  [30 %] 30 % PEEP:  [5 cmH20] 5 cmH20 Pressure Support:  [8 cmH20] 8 cmH20   Intake/Output Summary (Last 24 hours) at 09/11/2021 0920 Last data filed at 09/11/2021 0600 Gross per 24 hour  Intake 679.63 ml  Output 2460 ml  Net -1780.37 ml   Filed Weights   09/08/21 2107  Weight: 64.3 kg    Examination:  General - alert Eyes - pupils reactive ENT - no sinus tenderness, no stridor, wound dressing clean Cardiac - regular rate/rhythm, no murmur Chest - equal breath sounds b/l, no wheezing or rales Abdomen - soft, non tender, + bowel sounds Extremities - no cyanosis, clubbing, or edema Skin - no rashes Neuro - normal strength, moves extremities, follows commands Psych - normal mood and behavior   Resolved Hospital Problem list:  Compromised airway  Assessment & Plan:   Sublingual,  submandibular and submental abscesses s/p I&D. - day 5 of unasyn - d/c clindamycin after dosing on 10/01 per ID - completed decadron 9/30 - f/u blood culture 9/27, wound culture 9/28 - post op care per dental surgery  Incidental COVID 19 positive. - supportive care - airborne isolation for 10 days from positive test in hospital unless family can provide documentation of positive test prior to admission  Hx HTN. - goal SBP < 160 - hold outpt HCTZ  DM type 2 poorly controlled with steroid induced hyperglycemia. - SSI  Hx of osteoporosis. - hold outpt fosamax  Anemia of critical illness. - f/u CBC  Dysphagia. - speech therapy consulted  Disposition. - okay to transfer out of ICU >> defer to primary service - will ask [facility] hospitalist to consult from 10/2 as medicine consult and PCCM sign off   Labs    CMP Latest Ref Rng & Units 09/11/2021 09/09/2021 09/08/2021  Glucose 70 - 99 mg/dL 73 09/10/2021) -  BUN 8 - 23 mg/dL 154(M) 08(Q) -  Creatinine 0.44 - 1.00 mg/dL 76(P 9.50 -  Sodium 9.32 - 145 mmol/L 139 141 140  Potassium 3.5 - 5.1 mmol/L 3.7 3.8 3.6  Chloride 98 - 111 mmol/L 105 104 -  CO2 22 - 32 mmol/L 27 26 -  Calcium 8.9 - 10.3 mg/dL 7.9(L) 8.3(L) -  Total Protein 6.5 - 8.1 g/dL - - -  Total Bilirubin 0.3 - 1.2 mg/dL - - -  Alkaline Phos 38 - 126 U/L - - -  AST 15 - 41 U/L - - -  ALT 0 - 44 U/L - - -    CBC Latest Ref Rng & Units 09/11/2021 09/09/2021 09/08/2021  WBC 4.0 - 10.5 K/uL 11.6(H) 13.5(H) -  Hemoglobin 12.0 - 15.0 g/dL 10.4(L) 11.9(L) 10.9(L)  Hematocrit 36.0 - 46.0 % 31.8(L) 37.0 32.0(L)  Platelets 150 - 400 K/uL 244 239 -    ABG    Component Value Date/Time   PHART 7.422 09/08/2021 2346   PCO2ART 40.3 09/08/2021 2346   PO2ART 124 (H) 09/08/2021 2346   HCO3 26.3 09/08/2021 2346   TCO2 27 09/08/2021 2346   O2SAT 99.0 09/08/2021 2346    CBG (last 3)  Recent Labs    09/10/21 2309 09/11/21 0422 09/11/21 0732  GLUCAP 152* 78 77    Signature:   Coralyn Helling, MD [place] [facility] Pager - [phone] 09/11/2021, 9:20 AM

## 2021-09-11 NOTE — Progress Notes (Signed)
[facility] ADULT ICU REPLACEMENT PROTOCOL   The patient does apply for the [facility] Adult ICU Electrolyte Replacment Protocol based on the criteria listed below:   1.Exclusion criteria: TCTS patients, ECMO patients and Hypothermia Protocol, and   Dialysis patients 2. Is GFR >/= 30 ml/min? Yes.    Patient's GFR today is >60 3. Is SCr </= 2? Yes.   Patient's SCr is 0.81 mg/dL 4. Did SCr increase >/= 0.5 in 24 hours? No. 5.Pt's weight >40kg  Yes.   6. Abnormal electrolyte(s):  K 3.7  7. Electrolytes replaced per protocol 8.  Call MD STAT for K+ </= 2.5, Phos </= 1, or Mag </= 1 Physician:  S. Bobbye Morton R Eden Rho 09/11/2021 5:56 AM

## 2021-09-11 NOTE — Progress Notes (Signed)
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  WBC 11.6, afebrile.   Day 5 ampicillin/sulbactam Day 3 clindamycin   Physical Exam: Constitutional:  Vitals:   09/11/21 1133 09/11/21 1200  BP:  (!) 157/68  Pulse:  70  Resp:  13  Temp: 98.3 F (36.8 C)   SpO2:  99%   patient appears in NAD Respiratory: Normal respiratory effort; CTA B Cardiovascular: RRR Skin: no rash  Review of Systems: Constitutional: negative for fevers and chills Gastrointestinal: negative for nausea and diarrhea  Lab Results  Component Value Date   WBC 11.6 (H) 09/11/2021   HGB 10.4 (L) 09/11/2021   HCT 31.8 (L) 09/11/2021   MCV 91.9 09/11/2021   PLT 244 09/11/2021    Lab Results  Component Value Date   CREATININE 0.81 09/11/2021   BUN 28 (H) 09/11/2021   NA 139 09/11/2021   K 3.7 09/11/2021   CL 105 09/11/2021   CO2 27 09/11/2021    Lab Results  Component Value Date   ALT 58 (H) 09/08/2021   AST 51 (H) 09/08/2021   ALKPHOS 102 09/08/2021     Microbiology: Recent Results (from the past 240 hour(s))  Culture, blood (routine x 2)     Status: None (Preliminary result)   Collection Time: 09/07/21 10:10 PM   Specimen: BLOOD LEFT ARM  Result Value Ref Range Status   Specimen Description BLOOD LEFT ARM  Final   Special Requests   Final    BOTTLES DRAWN AEROBIC AND ANAEROBIC Blood Culture adequate volume   Culture   Final    NO GROWTH 4 DAYS Performed at [facility], [address], [place], [place] [address]    Report Status PENDING  Incomplete  Culture, blood (routine x 2)     Status: None (Preliminary result)   Collection Time: 09/07/21 10:30 PM   Specimen: BLOOD RIGHT ARM  Result Value Ref Range Status   Specimen Description BLOOD RIGHT ARM  Final   Special Requests   Final    BOTTLES DRAWN AEROBIC AND ANAEROBIC Blood Culture adequate volume   Culture   Final    NO GROWTH 4  DAYS Performed at [facility], [address], [place], [place] [address]    Report Status PENDING  Incomplete  Resp Panel by RT-PCR (Flu A&B, Covid) Nasopharyngeal Swab     Status: Abnormal   Collection Time: 09/07/21 10:46 PM   Specimen: Nasopharyngeal Swab; Nasopharyngeal(NP) swabs in vial transport medium  Result Value Ref Range Status   SARS Coronavirus 2 by RT PCR POSITIVE (A) NEGATIVE Final    Comment: RESULT CALLED TO, READ BACK BY AND VERIFIED WITH: RN Vera Furniss S. 09/08/21@00 :06 BY TW (NOTE) SARS-CoV-2 target nucleic acids are DETECTED.  The SARS-CoV-2 RNA is generally detectable in upper respiratory specimens during the acute phase of infection. Positive results are indicative of the presence of the identified virus, but do not rule out bacterial infection or co-infection with other pathogens not detected by the test. Clinical correlation with patient history and other diagnostic information is necessary to determine patient infection status. The expected result is Negative.  Fact Sheet for Patients: BloggerCourse.com  Fact Sheet for Healthcare Providers: SeriousBroker.it  This test is not yet approved or cleared by the [place] FDA and  has been authorized for detection and/or diagnosis of SARS-CoV-2 by FDA under an Emergency Use  Authorization (EUA).  This EUA will remain in effect (meaning this test can be  used) for the duration of  the COVID-19 declaration under Section 564(b)(1) of the Act, 21 U.S.C. section 360bbb-3(b)(1), unless the authorization is terminated or revoked sooner.     Influenza A by PCR NEGATIVE NEGATIVE Final   Influenza B by PCR NEGATIVE NEGATIVE Final    Comment: (NOTE) The Xpert Xpress SARS-CoV-2/FLU/RSV plus assay is intended as an aid in the diagnosis of influenza from Nasopharyngeal swab specimens and should not be used as a sole basis for treatment. Nasal washings and aspirates  are unacceptable for Xpert Xpress SARS-CoV-2/FLU/RSV testing.  Fact Sheet for Patients: BloggerCourse.com  Fact Sheet for Healthcare Providers: SeriousBroker.it  This test is not yet approved or cleared by the [place] FDA and has been authorized for detection and/or diagnosis of SARS-CoV-2 by FDA under an Emergency Use Authorization (EUA). This EUA will remain in effect (meaning this test can be used) for the duration of the COVID-19 declaration under Section 564(b)(1) of the Act, 21 U.S.C. section 360bbb-3(b)(1), unless the authorization is terminated or revoked.  Performed at [facility], [address] [place], [place] [address]   Aerobic/Anaerobic Culture w Gram Stain (surgical/deep wound)     Status: None (Preliminary result)   Collection Time: 09/08/21  7:28 PM   Specimen: Neck, Left; Abscess  Result Value Ref Range Status   Specimen Description ABSCESS  Final   Special Requests LEFT NECK SPEC A  Final   Gram Stain   Final    FEW SQUAMOUS EPITHELIAL CELLS PRESENT FEW WBC PRESENT, PREDOMINANTLY MONONUCLEAR MODERATE GRAM POSITIVE COCCI FEW GRAM NEGATIVE RODS FEW GRAM POSITIVE RODS Performed at [facility], [address] [place], [place] [address]    Culture   Final    CULTURE REINCUBATED FOR BETTER GROWTH NO ANAEROBES ISOLATED; CULTURE IN PROGRESS FOR 5 DAYS    Report Status PENDING  Incomplete  MRSA Next Gen by PCR, Nasal     Status: None   Collection Time: 09/08/21  9:09 PM   Specimen: Nasal Mucosa; Nasal Swab  Result Value Ref Range Status   MRSA by PCR Next Gen NOT DETECTED NOT DETECTED Final    Comment: (NOTE) The GeneXpert MRSA Assay (FDA approved for NASAL specimens only), is one component of a comprehensive MRSA colonization surveillance program. It is not intended to diagnose MRSA infection nor to guide or monitor treatment for MRSA infections. Test performance is not FDA approved in  patients less than 67 years old. Performed at [facility], [address], [place], [place] [address]     Impression/Plan:  1. Sublingual, submandibular and submental abscess - s/p I and D.  No growth to date on cultures.  Clindamycin now stopped today.  On ampicillin/sulbactam.   When ready for discharge, can use oral Augmentin for 2 weeks.   2.  COVID-19 positive - she is on airborne isolation for 10 days.  No indication for treatment otherwise.    3.  Leukocytosis - trending down and 11.6 today.  Will continue to monitor.

## 2021-09-12 DIAGNOSIS — E119 Type 2 diabetes mellitus without complications: Secondary | ICD-10-CM | POA: Diagnosis not present

## 2021-09-12 DIAGNOSIS — I10 Essential (primary) hypertension: Secondary | ICD-10-CM | POA: Diagnosis not present

## 2021-09-12 DIAGNOSIS — K122 Cellulitis and abscess of mouth: Secondary | ICD-10-CM | POA: Diagnosis not present

## 2021-09-12 LAB — CULTURE, BLOOD (ROUTINE X 2)
Culture: NO GROWTH
Culture: NO GROWTH
Special Requests: ADEQUATE
Special Requests: ADEQUATE

## 2021-09-12 LAB — CBC
HCT: 38.2 % (ref 36.0–46.0)
Hemoglobin: 12.3 g/dL (ref 12.0–15.0)
MCH: 30.1 pg (ref 26.0–34.0)
MCHC: 32.2 g/dL (ref 30.0–36.0)
MCV: 93.6 fL (ref 80.0–100.0)
Platelets: 295 10*3/uL (ref 150–400)
RBC: 4.08 MIL/uL (ref 3.87–5.11)
RDW: 12.4 % (ref 11.5–15.5)
WBC: 12.2 10*3/uL — ABNORMAL HIGH (ref 4.0–10.5)
nRBC: 0 % (ref 0.0–0.2)

## 2021-09-12 LAB — BASIC METABOLIC PANEL
Anion gap: 9 (ref 5–15)
BUN: 22 mg/dL (ref 8–23)
CO2: 29 mmol/L (ref 22–32)
Calcium: 8.4 mg/dL — ABNORMAL LOW (ref 8.9–10.3)
Chloride: 100 mmol/L (ref 98–111)
Creatinine, Ser: 0.8 mg/dL (ref 0.44–1.00)
GFR, Estimated: >60 mL/min (ref >60)
Glucose, Bld: 122 mg/dL — ABNORMAL HIGH (ref 70–99)
Potassium: 4.2 mmol/L (ref 3.5–5.1)
Sodium: 138 mmol/L (ref 135–145)

## 2021-09-12 LAB — GLUCOSE, CAPILLARY
Glucose-Capillary: 104 mg/dL — ABNORMAL HIGH (ref 70–99)
Glucose-Capillary: 103 mg/dL — ABNORMAL HIGH (ref 70–99)
Glucose-Capillary: 157 mg/dL — ABNORMAL HIGH (ref 70–99)
Glucose-Capillary: 192 mg/dL — ABNORMAL HIGH (ref 70–99)
Glucose-Capillary: 227 mg/dL — ABNORMAL HIGH (ref 70–99)

## 2021-09-12 MED ORDER — INSULIN ASPART 100 UNIT/ML IJ SOLN
4.0000 [IU] | Freq: Three times a day (TID) | INTRAMUSCULAR | Status: DC
  Administered 2021-09-12 – 2021-09-13 (×3): 4 [IU] via SUBCUTANEOUS

## 2021-09-12 MED ORDER — INSULIN ASPART 100 UNIT/ML IJ SOLN
0.0000 [IU] | Freq: Three times a day (TID) | INTRAMUSCULAR | Status: DC
  Administered 2021-09-12 (×2): 3 [IU] via SUBCUTANEOUS
  Administered 2021-09-13: 2 [IU] via SUBCUTANEOUS

## 2021-09-12 MED ORDER — INSULIN ASPART 100 UNIT/ML IJ SOLN: 0.0000 [IU] | Freq: Three times a day (TID) | INTRAMUSCULAR | Status: DC

## 2021-09-12 MED ORDER — INSULIN ASPART 100 UNIT/ML IJ SOLN
0.0000 [IU] | Freq: Every day | INTRAMUSCULAR | Status: DC
  Administered 2021-09-12: 2 [IU] via SUBCUTANEOUS

## 2021-09-12 MED ORDER — HYDRALAZINE HCL 25 MG PO TABS
25.0000 mg | ORAL_TABLET | Freq: Four times a day (QID) | ORAL | Status: DC | PRN
Start: 2021-09-12 — End: 2021-09-13
  Administered 2021-09-12: 25 mg via ORAL
  Filled 2021-09-12 (×2): qty 1

## 2021-09-12 MED ORDER — ORITAVANCIN DIPHOSPHATE 400 MG IV SOLR
1200.0000 mg | Freq: Once | INTRAVENOUS | Status: AC
  Administered 2021-09-12: 1200 mg via INTRAVENOUS
  Filled 2021-09-12: qty 120

## 2021-09-12 MED ORDER — HYDROCHLOROTHIAZIDE 25 MG PO TABS
25.0000 mg | ORAL_TABLET | Freq: Every morning | ORAL | Status: DC
  Administered 2021-09-12 – 2021-09-13 (×2): 25 mg via ORAL
  Filled 2021-09-12 (×2): qty 1

## 2021-09-12 MED ORDER — INSULIN ASPART 100 UNIT/ML IJ SOLN: 4.0000 [IU] | Freq: Three times a day (TID) | INTRAMUSCULAR | Status: DC

## 2021-09-12 NOTE — Progress Notes (Signed)
PROGRESS NOTE  Shannon Stephenson CHE:[id number] DOB: Aug 24, 1941   PCP: Macy Mis, MD  Patient is from: Home.  Independently ambulates at baseline.  DOA: 09/07/2021 LOS: 5  Chief complaints:  Chief Complaint  Patient presents with   Angioedema    Covid+     Brief Narrative / Interim history: 80 year old F with PMH of diet-controlled DM-2, HTN, spinal stenosis and osteoporosis presenting with 3 days of progressive oral pain and submandibular and sublingual swelling following dental extraction on 09/06/2021.  Incidentally tested positive for COVID-19.  CT soft tissue neck with large abscess of the sublingual space with extension into the left submandibular space but without airway stenosis.  She was started on IV steroid, Unasyn and clindamycin.  Nasally intubated for airway protection. Underwent I&D with Penrose drain placement by Dr. Ross Marcus on 09/08/2021.  Extubated on 09/10/2021.  ID consulted and stopped clindamycin.   Transferred to [facility] service on room air.  Abscess culture with Streptococcus mitis/paralysis with resistant to clindamycin and intermediate resistance to penicillin.  ID recommended a dose of oritavancin and and continuing Augmentin as previously planned.  Subjective: Seen and examined earlier this morning with the help of video interpreter with ID number [id number].  No major events overnight of this morning.  She reports gum swelling and "mild" pain.  She denies difficulty breathing or swallowing.  Denies chest pain, GI or UTI symptoms.  Objective: Vitals:   09/12/21 0910 09/12/21 1000 09/12/21 1100 09/12/21 1125  BP: (!) 156/83 (!) 122/57 (!) 124/58   Pulse: 81 77 74   Resp: 15 15 20    Temp:    98.4 F (36.9 C)  TempSrc:    Oral  SpO2: 99% 94% 95%   Weight:      Height:        Intake/Output Summary (Last 24 hours) at 09/12/2021 1402 Last data filed at 09/12/2021 1100 Gross per 24 hour  Intake 440 ml  Output 3801 ml  Net -3361 ml   Filed Weights   09/08/21  2107  Weight: 64.3 kg    Examination:  GENERAL: No apparent distress.  Nontoxic. HEENT: MMM.  Vision and hearing grossly intact.  Some residual submandibular swelling. NECK: For Penrose drains in her neck. RESP: 95% on RA.  No IWOB.  Fair aeration bilaterally. CVS:  RRR. Heart sounds normal.  ABD/GI/GU: BS+. Abd soft, NTND.  MSK/EXT:  Moves extremities. No apparent deformity. No edema.  SKIN: no apparent skin lesion or wound NEURO: Awake, alert and oriented appropriately.  No apparent focal neuro deficit. PSYCH: Calm. Normal affect.   Procedures:  9/28-I&D of sublingual/submandibular abscess with Penrose drain placement by Dr. 2108  Microbiology summarized: COVID-19 PCR positive. Influenza PCR negative. MRSA PCR nonreactive. Blood cultures NGTD. Abscess culture with rare Streptococcus constellatus, mitis and oralis, and few GNR and GPR Abscess fungal culture pending. Assessment & Plan: Sublingual, submandibular and submental abscesses s/p I&D and Penrose drain placement: Improving -Culture data as above. -9/27-Unasyn>>> -9/29-10/1-IV clindamycin.  -10/2-oritavancin per ID -Will be home once cleared by oral surgery  Dysphagia due to the above -On dysphagia 3 diet per SLP  Essential HTN: SBP slightly elevated. -Resume home HCTZ -P.o. hydralazine as needed  Diet controlled DM-2: A1c 6.8%.  Does not seem to be on medication for this. Recent Labs  Lab 09/11/21 1936 09/11/21 2330 09/12/21 0325 09/12/21 0720 09/12/21 1124  GLUCAP 161* 78 104* 103* 157*  -Change SSI to AC at bedtime   Hx of osteoporosis. -hold outpt fosamax.  Can resume on discharge.   Anemia of critical illness: H&H stable. Recent Labs    04/06/21 1813 09/07/21 1950 09/08/21 0448 09/08/21 2346 09/09/21 0626 09/11/21 0206 09/12/21 0610  HGB 12.4 12.2 12.4 10.9* 11.9* 10.4* 12.3   Leukocytosis/bandemia: Likely due to infection and steroid.  Improving.   Body mass index is 26.78  kg/m. Nutrition Problem: Inadequate oral intake Etiology: inability to eat Signs/Symptoms: NPO status Interventions: Refer to RD note for recommendations   DVT prophylaxis:  heparin injection 5,000 Units Start: 09/09/21 1545 Place and maintain sequential compression device Start: 09/08/21 1411  Code Status: Full code Family Communication: Patient and/or RN. Available if any question.  Level of care: Telemetry Medical Status is: Inpatient  Remains inpatient appropriate because:Ongoing diagnostic testing needed not appropriate for outpatient work up, IV treatments appropriate due to intensity of illness or inability to take PO, and Inpatient level of care appropriate due to severity of illness  Dispo: The patient is from: Home              Anticipated d/c is to: Home              Patient currently is not medically stable to d/c.   Difficult to place patient No       Consultants:  Oral surgery Infectious disease Pulmonology   Sch Meds:  Scheduled Meds:  Chlorhexidine Gluconate Cloth  6 each Topical Daily   docusate sodium  100 mg Oral BID   heparin injection (subcutaneous)  5,000 Units Subcutaneous Q8H   hydrochlorothiazide  25 mg Oral q morning   insulin aspart  0-15 Units Subcutaneous TID WC   insulin aspart  0-5 Units Subcutaneous QHS   insulin aspart  4 Units Subcutaneous TID WC   polyethylene glycol  17 g Oral Daily   senna  1 tablet Oral Daily   Continuous Infusions:  ampicillin-sulbactam (UNASYN) IV 3 g (09/12/21 0552)   PRN Meds:.acetaminophen **OR** acetaminophen, fentaNYL (SUBLIMAZE) injection, hydrALAZINE  Antimicrobials: Anti-infectives (From admission, onward)    Start     Dose/Rate Route Frequency Ordered Stop   09/09/21 1400  clindamycin (CLEOCIN) IVPB 900 mg        900 mg 100 mL/hr over 30 Minutes Intravenous Every 8 hours 09/09/21 1120 09/11/21 0531   09/08/21 0930  Ampicillin-Sulbactam (UNASYN) 3 g in sodium chloride 0.9 % 100 mL IVPB        3  g 200 mL/hr over 30 Minutes Intravenous On call to O.R. 09/08/21 5681 09/08/21 2015   09/08/21 0615  Ampicillin-Sulbactam (UNASYN) 3 g in sodium chloride 0.9 % 100 mL IVPB        3 g 200 mL/hr over 30 Minutes Intravenous Every 8 hours 09/08/21 0002     09/07/21 2215  Ampicillin-Sulbactam (UNASYN) 3 g in sodium chloride 0.9 % 100 mL IVPB        3 g 200 mL/hr over 30 Minutes Intravenous  Once 09/07/21 2204 09/07/21 2334        I have personally reviewed the following labs and images: CBC: Recent Labs  Lab 09/07/21 1950 09/08/21 0448 09/08/21 2346 09/09/21 0626 09/11/21 0206 09/12/21 0610  WBC 17.7* 19.1*  --  13.5* 11.6* 12.2*  NEUTROABS 16.5*  --   --  12.3*  --   --   HGB 12.2 12.4 10.9* 11.9* 10.4* 12.3  HCT 38.3 39.0 32.0* 37.0 31.8* 38.2  MCV 95.3 96.3  --  94.4 91.9 93.6  PLT 210 215  --  239 244 295   BMP &GFR Recent Labs  Lab 09/07/21 1950 09/08/21 0448 09/08/21 2346 09/09/21 0626 09/11/21 0206 09/12/21 0610  NA 136 137 140 141 139 138  K 3.8 4.6 3.6 3.8 3.7 4.2  CL 104 104  --  104 105 100  CO2 22 25  --  26 27 29   GLUCOSE 226* 195*  --  220* 73 122*  BUN 17 17  --  29* 28* 22  CREATININE 0.92 0.82  --  0.94 0.81 0.80  CALCIUM 8.6* 8.7*  --  8.3* 7.9* 8.4*   Estimated Creatinine Clearance: 49 mL/min (by C-G formula based on SCr of 0.8 mg/dL). Liver & Pancreas: Recent Labs  Lab 09/08/21 0448  AST 51*  ALT 58*  ALKPHOS 102  BILITOT 0.8  PROT 6.5  ALBUMIN 2.6*   No results for input(s): LIPASE, AMYLASE in the last 168 hours. No results for input(s): AMMONIA in the last 168 hours. Diabetic: No results for input(s): HGBA1C in the last 72 hours. Recent Labs  Lab 09/11/21 1936 09/11/21 2330 09/12/21 0325 09/12/21 0720 09/12/21 1124  GLUCAP 161* 78 104* 103* 157*   Cardiac Enzymes: No results for input(s): CKTOTAL, CKMB, CKMBINDEX, TROPONINI in the last 168 hours. No results for input(s): PROBNP in the last 8760 hours. Coagulation  Profile: No results for input(s): INR, PROTIME in the last 168 hours. Thyroid Function Tests: No results for input(s): TSH, T4TOTAL, FREET4, T3FREE, THYROIDAB in the last 72 hours. Lipid Profile: No results for input(s): CHOL, HDL, LDLCALC, TRIG, CHOLHDL, LDLDIRECT in the last 72 hours. Anemia Panel: No results for input(s): VITAMINB12, FOLATE, FERRITIN, TIBC, IRON, RETICCTPCT in the last 72 hours. Urine analysis:    Component Value Date/Time   COLORURINE YELLOW 04/06/2021 2220   APPEARANCEUR CLEAR 04/06/2021 2220   LABSPEC 1.013 04/06/2021 2220   PHURINE 6.0 04/06/2021 2220   GLUCOSEU NEGATIVE 04/06/2021 2220   HGBUR NEGATIVE 04/06/2021 2220   BILIRUBINUR NEGATIVE 04/06/2021 2220   KETONESUR NEGATIVE 04/06/2021 2220   PROTEINUR NEGATIVE 04/06/2021 2220   UROBILINOGEN 0.2 08/13/2013 2022   NITRITE NEGATIVE 04/06/2021 2220   LEUKOCYTESUR NEGATIVE 04/06/2021 2220   Sepsis Labs: Invalid input(s): PROCALCITONIN, LACTICIDVEN  Microbiology: Recent Results (from the past 240 hour(s))  Culture, blood (routine x 2)     Status: None   Collection Time: 09/07/21 10:10 PM   Specimen: BLOOD LEFT ARM  Result Value Ref Range Status   Specimen Description BLOOD LEFT ARM  Final   Special Requests   Final    BOTTLES DRAWN AEROBIC AND ANAEROBIC Blood Culture adequate volume   Culture   Final    NO GROWTH 5 DAYS Performed at [facility], [address] [place], [place]    Report Status 09/12/2021 FINAL  Final  Culture, blood (routine x 2)     Status: None   Collection Time: 09/07/21 10:30 PM   Specimen: BLOOD RIGHT ARM  Result Value Ref Range Status   Specimen Description BLOOD RIGHT ARM  Final   Special Requests   Final    BOTTLES DRAWN AEROBIC AND ANAEROBIC Blood Culture adequate volume   Culture   Final    NO GROWTH 5 DAYS Performed at [facility], [address] [place], [place]    Report Status 09/12/2021 FINAL  Final  Resp Panel by RT-PCR (Flu  A&B, Covid) Nasopharyngeal Swab     Status: Abnormal   Collection Time: 09/07/21 10:46 PM  Specimen: Nasopharyngeal Swab; Nasopharyngeal(NP) swabs in vial transport medium  Result Value Ref Range Status   SARS Coronavirus 2 by RT PCR POSITIVE (A) NEGATIVE Final    Comment: RESULT CALLED TO, READ BACK BY AND VERIFIED WITH: RN ROBERT S. 09/08/21@00 :06 BY TW (NOTE) SARS-CoV-2 target nucleic acids are DETECTED.  The SARS-CoV-2 RNA is generally detectable in upper respiratory specimens during the acute phase of infection. Positive results are indicative of the presence of the identified virus, but do not rule out bacterial infection or co-infection with other pathogens not detected by the test. Clinical correlation with patient history and other diagnostic information is necessary to determine patient infection status. The expected result is Negative.  Fact Sheet for Patients: BloggerCourse.com  Fact Sheet for Healthcare Providers: SeriousBroker.it  This test is not yet approved or cleared by the [place] FDA and  has been authorized for detection and/or diagnosis of SARS-CoV-2 by FDA under an Emergency Use Authorization (EUA).  This EUA will remain in effect (meaning this test can be  used) for the duration of  the COVID-19 declaration under Section 564(b)(1) of the Act, 21 U.S.C. section 360bbb-3(b)(1), unless the authorization is terminated or revoked sooner.     Influenza A by PCR NEGATIVE NEGATIVE Final   Influenza B by PCR NEGATIVE NEGATIVE Final    Comment: (NOTE) The Xpert Xpress SARS-CoV-2/FLU/RSV plus assay is intended as an aid in the diagnosis of influenza from Nasopharyngeal swab specimens and should not be used as a sole basis for treatment. Nasal washings and aspirates are unacceptable for Xpert Xpress SARS-CoV-2/FLU/RSV testing.  Fact Sheet for Patients: BloggerCourse.com  Fact Sheet  for Healthcare Providers: SeriousBroker.it  This test is not yet approved or cleared by the [place] FDA and has been authorized for detection and/or diagnosis of SARS-CoV-2 by FDA under an Emergency Use Authorization (EUA). This EUA will remain in effect (meaning this test can be used) for the duration of the COVID-19 declaration under Section 564(b)(1) of the Act, 21 U.S.C. section 360bbb-3(b)(1), unless the authorization is terminated or revoked.  Performed at [facility], [address] [place], [place] [address]   Aerobic/Anaerobic Culture w Gram Stain (surgical/deep wound)     Status: None (Preliminary result)   Collection Time: 09/08/21  7:28 PM   Specimen: Neck, Left; Abscess  Result Value Ref Range Status   Specimen Description ABSCESS  Final   Special Requests LEFT NECK SPEC A  Final   Gram Stain   Final    FEW SQUAMOUS EPITHELIAL CELLS PRESENT FEW WBC PRESENT, PREDOMINANTLY MONONUCLEAR MODERATE GRAM POSITIVE COCCI FEW GRAM NEGATIVE RODS FEW GRAM POSITIVE RODS    Culture   Final    RARE STREPTOCOCCUS CONSTELLATUS RARE STREPTOCOCCUS MITIS/ORALIS RARE CARDIOBACTERIUM HOMINIS Standardized susceptibility testing for this organism is not available. NO ANAEROBES ISOLATED; CULTURE IN PROGRESS FOR 5 DAYS    Report Status PENDING  Incomplete   Organism ID, Bacteria STREPTOCOCCUS MITIS/ORALIS  Final      Susceptibility   Streptococcus mitis/oralis - MIC*    TETRACYCLINE 4 SENSITIVE Sensitive     VANCOMYCIN 0.5 SENSITIVE Sensitive     CLINDAMYCIN >=1 RESISTANT Resistant     PENICILLIN Value in next row Intermediate      INTERMEDIATE1    CEFTRIAXONE Value in next row Intermediate      INTERMEDIATE2Performed at [facility] Lab, [address], [place], [place] [address]    * RARE STREPTOCOCCUS MITIS/ORALIS  MRSA Next Gen by PCR, Nasal  Status: None   Collection Time: 09/08/21  9:09 PM   Specimen: Nasal Mucosa; Nasal Swab   Result Value Ref Range Status   MRSA by PCR Next Gen NOT DETECTED NOT DETECTED Final    Comment: (NOTE) The GeneXpert MRSA Assay (FDA approved for NASAL specimens only), is one component of a comprehensive MRSA colonization surveillance program. It is not intended to diagnose MRSA infection nor to guide or monitor treatment for MRSA infections. Test performance is not FDA approved in patients less than 89 years old. Performed at [facility], [address] [place], [place] [address]     Radiology Studies: No results found.    Avonda Toso T. Najee Cowens [facility]  If 7PM-7AM, please contact night-coverage [email or web address] 09/12/2021, 2:02 PM

## 2021-09-12 NOTE — Progress Notes (Signed)
Walking SPO2: Patient Saturations on Room Air at Rest = 98%  Patient Saturations on Room Air while Ambulating = 93%

## 2021-09-12 NOTE — Evaluation (Signed)
Physical Therapy Evaluation Patient Details Name: Shannon Stephenson MRN: [id number] DOB: 1941/02/19 Today's Date: 09/12/2021  History of Present Illness  80 year old woman presented 09/07/21 after a dental procedure with a sublingual and left submandibular abscess.  incidental COVID +; 09/08/21 underwent I&D of bil oral abscesses with post-op remaining intubated until 9/30 extubation  PMH of DM2, HTN  Clinical Impression   Pt admitted secondary to problem above with deficits below. PTA patient was modified independent to independent in her home environment (occasionally using cane vs RW per pt).  Pt currently requires min guard assist to ambulate 20 ft x 2 with seated rest. Pt was "furniture surfing" in hospital room and will benefit from use of RW at this time (she already owns).  Anticipate patient will benefit from PT to address problems listed below.Will continue to follow acutely to maximize functional mobility independence and safety.          Recommendations for follow up therapy are one component of a multi-disciplinary discharge planning process, led by the attending physician.  Recommendations may be updated based on patient status, additional functional criteria and insurance authorization.  Follow Up Recommendations No PT follow up    Equipment Recommendations  None recommended by PT    Recommendations for Other Services       Precautions / Restrictions Precautions Precautions: Fall;Other (comment) Precaution Comments: penrose drains in neck      Mobility  Bed Mobility Overal bed mobility: Independent             General bed mobility comments: off high ICU bed    Transfers Overall transfer level: Needs assistance Equipment used: None Transfers: Sit to/from Stand Sit to Stand: Min guard         General transfer comment: for safety due to height of bed (ICU); pt able to come off bed and later return to bed without assistance  Ambulation/Gait Ambulation/Gait  assistance: Min guard Gait Distance (Feet): 20 Feet (toileted, 20 ft) Assistive device: 1 person hand held assist Gait Pattern/deviations: Step-through pattern;Decreased stride length;Trunk flexed Gait velocity: very slow Gait velocity interpretation: <1.8 ft/sec, indicate of risk for recurrent falls General Gait Details: reaching to hold onto objects (initially told PT she did not use walking device; with furniture-walking in room she then stated she uses cane vs RW sometimes); denied dizziness; feels weak  Stairs            Wheelchair Mobility    Modified Rankin (Stroke Patients Only)       Balance Overall balance assessment: Needs assistance Sitting-balance support: No upper extremity supported;Feet unsupported Sitting balance-Leahy Scale: Good     Standing balance support: Single extremity supported Standing balance-Leahy Scale: Poor Standing balance comment: seeking UE support even in static standing                             Pertinent Vitals/Pain Pain Assessment: Faces Faces Pain Scale: Hurts a little bit Pain Location: neck; surgical sites Pain Descriptors / Indicators: Discomfort Pain Intervention(s): Limited activity within patient's tolerance    Home Living Family/patient expects to be discharged to:: Private residence Living Arrangements: Children Available Help at Discharge: Family Type of Home: House Home Access: Stairs to enter Entrance Stairs-Rails: None Entrance Stairs-Number of Steps: 4 Home Layout: Two level Home Equipment: Environmental consultant - 2 wheels;Cane - single point      Prior Function Level of Independence: Needs assistance   Gait / Transfers Assistance  Needed: reports normally walks without device, but occasionally uses cane or RW if feeling weak  ADL's / Homemaking Assistance Needed: independent with BADLs; daughter does IADLs        Hand Dominance   Dominant Hand: Right    Extremity/Trunk Assessment   Upper Extremity  Assessment Upper Extremity Assessment: Generalized weakness    Lower Extremity Assessment Lower Extremity Assessment: Generalized weakness    Cervical / Trunk Assessment Cervical / Trunk Assessment: Normal  Communication   Communication: Prefers language other than English [place]; interpreter Designer, television/film set [phone])  Cognition Arousal/Alertness: Awake/alert Behavior During Therapy: WFL for tasks assessed/performed Overall Cognitive Status: Within Functional Limits for tasks assessed                                        General Comments General comments (skin integrity, edema, etc.): VSS on monitor and RA    Exercises     Assessment/Plan    PT Assessment Patient needs continued PT services  PT Problem List Decreased strength;Decreased activity tolerance;Decreased balance;Decreased mobility;Decreased knowledge of use of DME;Decreased safety awareness       PT Treatment Interventions DME instruction;Gait training;Stair training;Functional mobility training;Therapeutic activities;Therapeutic exercise;Patient/family education;Balance training    PT Goals (Current goals can be found in the Care Plan section)  Acute Rehab PT Goals Patient Stated Goal: to get stronger PT Goal Formulation: With patient Time For Goal Achievement: 09/26/21 Potential to Achieve Goals: Good    Frequency Min 3X/week   Barriers to discharge        Co-evaluation               AM-PAC PT "6 Clicks" Mobility  Outcome Measure Help needed turning from your back to your side while in a flat bed without using bedrails?: None Help needed moving from lying on your back to sitting on the side of a flat bed without using bedrails?: None Help needed moving to and from a bed to a chair (including a wheelchair)?: A Little Help needed standing up from a chair using your arms (e.g., wheelchair or bedside chair)?: A Little Help needed to walk in hospital room?: A Little Help needed  climbing 3-5 steps with a railing? : A Little 6 Click Score: 20    End of Session Equipment Utilized During Treatment: Gait belt Activity Tolerance: Patient limited by fatigue Patient left: in bed;with call bell/phone within reach;with bed alarm set Nurse Communication: Mobility status;Other (comment) (had BM; walker would be helpful) PT Visit Diagnosis: Unsteadiness on feet (R26.81)    Time: 1250-1315 PT Time Calculation (min) (ACUTE ONLY): 25 min   Charges:   PT Evaluation $PT Eval Low Complexity: 1 Low           Jerolyn Center, PT Pager [phone]   Zena Amos 09/12/2021, 1:33 PM

## 2021-09-12 NOTE — Progress Notes (Signed)
Assisted tele visit to patient with family member.  Doniel Maiello M, RN  

## 2021-09-13 ENCOUNTER — Other Ambulatory Visit: Payer: Self-pay

## 2021-09-13 ENCOUNTER — Emergency Department (HOSPITAL_COMMUNITY): Payer: Medicare Other

## 2021-09-13 ENCOUNTER — Observation Stay (HOSPITAL_COMMUNITY)
Admission: EM | Admit: 2021-09-13 | Discharge: 2021-09-15 | Disposition: A | Discharge status: Home / Self Care | Payer: Medicare Other | Attending: Family Medicine | Admitting: Family Medicine

## 2021-09-13 DIAGNOSIS — I10 Essential (primary) hypertension: Secondary | ICD-10-CM | POA: Insufficient documentation

## 2021-09-13 DIAGNOSIS — J9601 Acute respiratory failure with hypoxia: Secondary | ICD-10-CM | POA: Diagnosis not present

## 2021-09-13 DIAGNOSIS — I214 Non-ST elevation (NSTEMI) myocardial infarction: Secondary | ICD-10-CM

## 2021-09-13 DIAGNOSIS — R0789 Other chest pain: Secondary | ICD-10-CM | POA: Diagnosis not present

## 2021-09-13 DIAGNOSIS — R079 Chest pain, unspecified: Secondary | ICD-10-CM | POA: Diagnosis present

## 2021-09-13 DIAGNOSIS — D72829 Elevated white blood cell count, unspecified: Secondary | ICD-10-CM | POA: Insufficient documentation

## 2021-09-13 DIAGNOSIS — Z79899 Other long term (current) drug therapy: Secondary | ICD-10-CM | POA: Insufficient documentation

## 2021-09-13 DIAGNOSIS — D72825 Bandemia: Secondary | ICD-10-CM

## 2021-09-13 DIAGNOSIS — E119 Type 2 diabetes mellitus without complications: Secondary | ICD-10-CM | POA: Insufficient documentation

## 2021-09-13 DIAGNOSIS — K122 Cellulitis and abscess of mouth: Secondary | ICD-10-CM | POA: Diagnosis present

## 2021-09-13 DIAGNOSIS — U071 COVID-19: Secondary | ICD-10-CM | POA: Insufficient documentation

## 2021-09-13 LAB — CBC WITH DIFFERENTIAL/PLATELET
Abs Immature Granulocytes: 1.2 10*3/uL — ABNORMAL HIGH (ref 0.00–0.07)
Basophils Absolute: 0 10*3/uL (ref 0.0–0.1)
Basophils Relative: 0 %
Eosinophils Absolute: 0 10*3/uL (ref 0.0–0.5)
Eosinophils Relative: 0 %
HCT: 41.1 % (ref 36.0–46.0)
Hemoglobin: 12.7 g/dL (ref 12.0–15.0)
Lymphocytes Relative: 11 %
Lymphs Abs: 1.9 10*3/uL (ref 0.7–4.0)
MCH: 30.4 pg (ref 26.0–34.0)
MCHC: 30.9 g/dL (ref 30.0–36.0)
MCV: 98.3 fL (ref 80.0–100.0)
Metamyelocytes Relative: 3 %
Monocytes Absolute: 0.2 10*3/uL (ref 0.1–1.0)
Monocytes Relative: 1 %
Myelocytes: 4 %
Neutro Abs: 14.2 10*3/uL — ABNORMAL HIGH (ref 1.7–7.7)
Neutrophils Relative %: 81 %
Platelets: 304 10*3/uL (ref 150–400)
RBC: 4.18 MIL/uL (ref 3.87–5.11)
RDW: 12.3 % (ref 11.5–15.5)
WBC: 17.5 10*3/uL — ABNORMAL HIGH (ref 4.0–10.5)
nRBC: 0 % (ref 0.0–0.2)
nRBC: 0 /100 WBC

## 2021-09-13 LAB — COMPREHENSIVE METABOLIC PANEL
ALT: 36 U/L (ref 0–44)
AST: 32 U/L (ref 15–41)
Albumin: 2.5 g/dL — ABNORMAL LOW (ref 3.5–5.0)
Alkaline Phosphatase: 60 U/L (ref 38–126)
Anion gap: 9 (ref 5–15)
BUN: 24 mg/dL — ABNORMAL HIGH (ref 8–23)
CO2: 26 mmol/L (ref 22–32)
Calcium: 8.4 mg/dL — ABNORMAL LOW (ref 8.9–10.3)
Chloride: 96 mmol/L — ABNORMAL LOW (ref 98–111)
Creatinine, Ser: 0.89 mg/dL (ref 0.44–1.00)
GFR, Estimated: >60 mL/min (ref >60)
Glucose, Bld: 180 mg/dL — ABNORMAL HIGH (ref 70–99)
Potassium: 4.2 mmol/L (ref 3.5–5.1)
Sodium: 131 mmol/L — ABNORMAL LOW (ref 135–145)
Total Bilirubin: 0.8 mg/dL (ref 0.3–1.2)
Total Protein: 6.1 g/dL — ABNORMAL LOW (ref 6.5–8.1)

## 2021-09-13 LAB — AEROBIC/ANAEROBIC CULTURE W GRAM STAIN (SURGICAL/DEEP WOUND)

## 2021-09-13 LAB — GLUCOSE, CAPILLARY: Glucose-Capillary: 125 mg/dL — ABNORMAL HIGH (ref 70–99)

## 2021-09-13 LAB — TROPONIN I (HIGH SENSITIVITY)
Troponin I (High Sensitivity): 53 ng/L — ABNORMAL HIGH (ref <18)
Troponin I (High Sensitivity): 142 ng/L (ref <18)

## 2021-09-13 MED ORDER — MUPIROCIN 2 % EX OINT
TOPICAL_OINTMENT | CUTANEOUS | Status: AC
  Filled 2021-09-13: qty 22

## 2021-09-13 MED ORDER — AMOXICILLIN-POT CLAVULANATE 875-125 MG PO TABS: 1.0000 | ORAL_TABLET | Freq: Two times a day (BID) | ORAL | 0 refills | Status: AC

## 2021-09-13 MED ORDER — IOHEXOL 350 MG/ML SOLN
80.0000 mL | Freq: Once | INTRAVENOUS | Status: AC | PRN
  Administered 2021-09-13: 80 mL via INTRAVENOUS

## 2021-09-13 MED ORDER — CHLORHEXIDINE GLUCONATE 0.12 % MT SOLN: 15.0000 mL | Freq: Four times a day (QID) | OROMUCOSAL | 0 refills | Status: AC

## 2021-09-13 NOTE — ED Notes (Signed)
Provider aware of trop

## 2021-09-13 NOTE — ED Notes (Signed)
Pt to CT

## 2021-09-13 NOTE — Progress Notes (Signed)
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  Interval History 9/29 - NAEON. Patient stable and intubated, lightly sedated. WBC down to 13. Clinical exam improved compared to pre-op status.  9/30 - NAEON. Pt on vent, stable. Labs not drawn today. BS elevated likely 2/2 steroids. Overall clinical picture much improved. Only slight drainage from penrose drains. 10/3 - Patient was extubated uneventfully over the weekend. WBC continued to trend down. Subjectively patient reports overall feeling much better, still a little swelling/soreness in submental region but overall much improved. Minimal output from drain. BS have been elevated. Cultures showing strep, some resistance to clinda/PCN.     Clinical Exam: Extraoral Exam:              Patient is AAOx3, no disterss             Incisions/drains are intact with minimal SS drainage; slight submental swelling  Intraoral Exam:              The patient does not have trismus with maximum incisal opening of 50 mm.             The buccal vestibules are not raised.             The floor of mouth is minimally  indurated and elevated bilaterally             The tongue is not elevated.             There is not lateral pharyngeal swelling with the uvula midline             There is no palatal draping present.             Oral airway is patent                Assessment: 82 yoF w/ DM2 with a bilateral submandibular, sublingual, and submental space infection consistent with Ludwig's angina s/p extraoral I&D on 09/08/21. Overall patient is doing well and continuing to make good clinical improvement.   Plan:    OMFS Recommendations -Okay to D/C from OMFS perspective; drains removed today -Defer to ID for outpatient antibiotic regimen -Follow up in clinic [facility] in [place]) as needed  -Continue soft mechanical as  patient tolerates -Peridex (chlorhexidine) mouthrnise QID -Change Kerlix dressing as needed, can switch to taped gauze over incisions -Follow cultures   Herbie Saxon, DDS Oral and Maxillofacial Surgeon [facility] [place]) Office # [phone] Cell # [phone]

## 2021-09-13 NOTE — Plan of Care (Signed)
?  Problem: Education: ?Goal: Knowledge of General Education information will improve ?Description: Including pain rating scale, medication(s)/side effects and non-pharmacologic comfort measures ?Outcome: Adequate for Discharge ?  ?Problem: Health Behavior/Discharge Planning: ?Goal: Ability to manage health-related needs will improve ?Outcome: Adequate for Discharge ?  ?Problem: Clinical Measurements: ?Goal: Ability to maintain clinical measurements within normal limits will improve ?Outcome: Adequate for Discharge ?Goal: Will remain free from infection ?Outcome: Adequate for Discharge ?Goal: Diagnostic test results will improve ?Outcome: Adequate for Discharge ?Goal: Respiratory complications will improve ?Outcome: Adequate for Discharge ?Goal: Cardiovascular complication will be avoided ?Outcome: Adequate for Discharge ?  ?Problem: Activity: ?Goal: Risk for activity intolerance will decrease ?Outcome: Adequate for Discharge ?  ?Problem: Nutrition: ?Goal: Adequate nutrition will be maintained ?Outcome: Adequate for Discharge ?  ?Problem: Coping: ?Goal: Level of anxiety will decrease ?Outcome: Adequate for Discharge ?  ?Problem: Elimination: ?Goal: Will not experience complications related to bowel motility ?Outcome: Adequate for Discharge ?Goal: Will not experience complications related to urinary retention ?Outcome: Adequate for Discharge ?  ?Problem: Pain Managment: ?Goal: General experience of comfort will improve ?Outcome: Adequate for Discharge ?  ?Problem: Safety: ?Goal: Ability to remain free from injury will improve ?Outcome: Adequate for Discharge ?  ?Problem: Skin Integrity: ?Goal: Risk for impaired skin integrity will decrease ?Outcome: Adequate for Discharge ?  ?Problem: Activity: ?Goal: Ability to tolerate increased activity will improve ?Outcome: Adequate for Discharge ?  ?Problem: Respiratory: ?Goal: Ability to maintain a clear airway and adequate ventilation will improve ?Outcome: Adequate for  Discharge ?  ?Problem: Role Relationship: ?Goal: Method of communication will improve ?Outcome: Adequate for Discharge ?  ?

## 2021-09-13 NOTE — ED Provider Notes (Signed)
I saw and evaluated the patient, reviewed the resident's note and I agree with the findings and plan.  Pertinent History: Pt with recent procedure to drain sublingual abscess / ludwig's angina - d/c from hospital 7 hours ago - and comes back after having some transient R upper chest burning - which has resolved - was that only COVID-positive, states that she is also fatigued.  EMS report some PACs on the monitor but no other acute findings, no history of coronary disease  Pertinent Exam findings: No distress, well-appearing, breathing without any difficulty, able to open and close the mouth, Penrose drain site in the right submandibular area is clean and nonbleeding not swollen not red not indurated.   I was personally present and directly supervised the following procedures:  Medical evaluation   EKG Interpretation  Date/Time:  Monday September 13 2021 19:54:57 EDT Ventricular Rate:  83 PR Interval:  140 QRS Duration: 80 QT Interval:  384 QTC Calculation: 452 R Axis:   11 Text Interpretation: Sinus rhythm Consider left ventricular hypertrophy unchanged Confirmed by Eber Hong ([id number]) on 09/13/2021 9:56:01 PM Also confirmed by Eber Hong ([id number]), editor Dianah Field [phone])  on 09/14/2021 7:39:12 AM         I personally interpreted the EKG as well as the resident and agree with the interpretation on the resident's chart.  Final diagnoses:  None      Eber Hong, MD 09/15/21 1144

## 2021-09-13 NOTE — Progress Notes (Signed)
    [facility]    Date of Admission:  09/07/2021   Total days of antibiotics 7           ID: Shannon Stephenson is a 80 y.o. female with polymicrobial submandibular/sublingual abscess - odontogenic source s/p I x D Principal Problem:   Ludwig's angina Active Problems:   T2DM (type 2 diabetes mellitus) (HCC)   HTN (hypertension)   Mouth abscess   Endotracheal tube present   Acute respiratory failure with hypoxia (HCC)    Subjective: Afebrile. Mild pain underneath her jaw. No diarrhea. Nor vomiting  12 point ros is negative except jaw discomfort  Medications:   Chlorhexidine Gluconate Cloth  6 each Topical Daily   docusate sodium  100 mg Oral BID   heparin injection (subcutaneous)  5,000 Units Subcutaneous Q8H   hydrochlorothiazide  25 mg Oral q morning   insulin aspart  0-15 Units Subcutaneous TID WC   insulin aspart  0-5 Units Subcutaneous QHS   insulin aspart  4 Units Subcutaneous TID WC   mupirocin ointment       polyethylene glycol  17 g Oral Daily   senna  1 tablet Oral Daily    Objective: Vital signs in last 24 hours: Temp:  [98.1 F (36.7 C)-98.3 F (36.8 C)] 98.3 F (36.8 C) (10/03 0731) Pulse Rate:  [64-89] 84 (10/03 1000) Resp:  [11-22] 14 (10/03 1000) BP: (107-195)/(53-115) 127/66 (10/03 1000) SpO2:  [92 %-100 %] 98 % (10/03 1000) Physical Exam  Constitutional:  oriented to person, place, and time. appears well-developed and well-nourished. No distress.  HENT: [place]/AT, PERRLA, no scleral icterus Mouth/Throat: Oropharynx is clear and moist. No oropharyngeal exudate.  Cardiovascular: Normal rate, regular rhythm and normal heart sounds. Exam reveals no gallop and no friction rub.  No murmur heard.  Pulmonary/Chest: Effort normal and breath sounds normal. No respiratory distress.  has no wheezes.  Neck = supple, no nuchal rigidity; indurated submental.  Abdominal: Soft. Bowel sounds are normal.  exhibits no distension. There is no  tenderness.  Lymphadenopathy: no cervical adenopathy. No axillary adenopathy Neurological: alert and oriented to person, place, and time.  Skin: Skin is warm and dry. No rash noted. No erythema.  Psychiatric: a normal mood and affect.  behavior is normal.    Lab Results Recent Labs    09/11/21 0206 09/12/21 0610  WBC 11.6* 12.2*  HGB 10.4* 12.3  HCT 31.8* 38.2  NA 139 138  K 3.7 4.2  CL 105 100  CO2 27 29  BUN 28* 22  CREATININE 0.81 0.80    Microbiology:  RARE STREPTOCOCCUS CONSTELLATUS  RARE STREPTOCOCCUS MITIS/ORALIS  RARE CARDIOBACTERIUM HOMINIS  Studies/Results: No results found.   Assessment/Plan: Polymicrobial sublingual/mandibular odontogenic infection s/p I x D = received a dose of oritavancin yesterday to cover strep mitis/oralis. Amox/clav would cover strep constellatus and hacek organism of cardiobacterium hominis. Plan to discharge on 2 more weeks of amox/clav 875mg  BID. Follow up with dentistry. Continue with gauze application. Can do bacitracin daily on wound incision if needed  Leukocytosis = trending down  Covid-19 = can discharge home with mask and ensure to mask when in public.  Health maintenance = recommend flu vaccine on discharge.  [facility] Pager: [phone]  09/13/2021, 11:58 AM

## 2021-09-13 NOTE — ED Notes (Signed)
RN unable to obtain IV access, MD Hyacinth Meeker notified.

## 2021-09-13 NOTE — ED Provider Notes (Addendum)
[facility] EMERGENCY DEPARTMENT Provider Note   CSN: [id number] Arrival date & time: 09/13/21  1916     History Chief Complaint  Patient presents with   Chest Pain    Shannon Stephenson is a 80 y.o. female.  HPI This is a 75 year old patient with history of diabetes and hypertension and recent hospitalization for neck abscess and Ludewig's angina requiring intubation who presents with chest pain.  Patient reports being discharged from the hospital around noon and couple hours after getting home experiencing chest pain. Described as right-sided, sharp, nonradiating, associated with shortness of breath, lasting 30 minutes to an hour and resolved without intervention.  Denies associated nausea, vomiting, or diaphoresis.  No history of cardiac disease.  No history of blood clots, stroke, no history of smoking.  Not on any exogenous hormones. Has been taking Augmentin BID since discharge today. Tested positive for COVID 5 days ago on admission but denies fever, chills, cough.   On chart review patient was admitted and found to have neck abscess and Ludwigs angina. Was intubated and in ICU. Required OR for I&D of abscess and was discharged today at noon.    Past Medical History:  Diagnosis Date   Back pain    Diabetes mellitus without complication (HCC)    Hypertension     Patient Active Problem List   Diagnosis Date Noted   Chest pain 09/14/2021   Mouth abscess 09/08/2021   Endotracheal tube present    Acute respiratory failure with hypoxia (HCC)    Ludwig's angina 09/07/2021   T2DM (type 2 diabetes mellitus) (HCC) 09/07/2021   HTN (hypertension) 09/07/2021   Spinal stenosis of lumbar region 10/09/2013    Past Surgical History:  Procedure Laterality Date   INCISION AND DRAINAGE ABSCESS N/A 09/08/2021   Procedure: INCISION AND DRAINAGE NECK ABSCESS;  Surgeon: Enis Slipper, DMD;  Location: [facility] OR;  Service: Oral Surgery;  Laterality: N/A;   LUMBAR  LAMINECTOMY/DECOMPRESSION MICRODISCECTOMY Bilateral 10/09/2013   Procedure: MICRO LUMBAR DECOMPRESSION L3-L5 (2 LEVELS);  Surgeon: Javier Docker, MD;  Location: [facility] ORS;  Service: Orthopedics;  Laterality: Bilateral;   NO PAST SURGERIES       OB History   No obstetric history on file.     Family History  Problem Relation Age of Onset   Breast cancer Neg Hx     Social History   Tobacco Use   Smoking status: Never   Smokeless tobacco: Never  Substance Use Topics   Alcohol use: No   Drug use: No    Home Medications Prior to Admission medications   Medication Sig Start Date End Date Taking? Authorizing Provider  acetaminophen (TYLENOL) 500 MG tablet Take 500 mg by mouth every 6 (six) hours as needed for pain.    [provider]  alendronate (FOSAMAX) 70 MG tablet Take 70 mg by mouth once a week. 08/22/21   [provider]  amoxicillin-clavulanate (AUGMENTIN) 875-125 MG tablet Take 1 tablet by mouth 2 (two) times daily for 13 days. 09/13/21 09/26/21  Almon Hercules, MD  chlorhexidine (PERIDEX) 0.12 % solution Use as directed 15 mLs in the mouth or throat 4 (four) times daily. 09/13/21   Almon Hercules, MD  conjugated estrogens (PREMARIN) vaginal cream Place 1 Applicatorful vaginally daily. Patient not taking: No sig reported 01/24/17   Audry Pili, PA-C  hydrochlorothiazide (HYDRODIURIL) 25 MG tablet Take 25 mg by mouth every morning.     [provider]  ibuprofen (ADVIL)  600 MG tablet Take 600 mg by mouth every 6 (six) hours as needed for pain. 09/06/21   [provider]  traMADol (ULTRAM) 50 MG tablet Take 1 tablet (50 mg total) by mouth every 6 (six) hours as needed for pain. 10/09/13   Jene Every, MD    Allergies    Patient has no known allergies.  Review of Systems   Review of Systems  Constitutional:  Negative for chills and fever.  HENT:  Negative for ear pain and sore throat.   Eyes:  Negative for pain and visual disturbance.   Respiratory:  Positive for shortness of breath. Negative for cough.   Cardiovascular:  Positive for chest pain. Negative for palpitations.  Gastrointestinal:  Negative for abdominal pain and vomiting.  Genitourinary:  Negative for dysuria and hematuria.  Musculoskeletal:  Negative for arthralgias and back pain.  Skin:  Negative for color change and rash.  Neurological:  Negative for seizures and syncope.  All other systems reviewed and are negative.  Physical Exam Updated Vital Signs BP 111/63   Pulse 77   Temp 98.4 F (36.9 C)   Resp (!) 21   SpO2 96%   Physical Exam Vitals and nursing note reviewed.  Constitutional:      General: She is not in acute distress.    Appearance: She is well-developed.  HENT:     Head: Normocephalic and atraumatic.  Eyes:     Conjunctiva/sclera: Conjunctivae normal.  Cardiovascular:     Rate and Rhythm: Normal rate and regular rhythm.     Heart sounds: No murmur heard. Pulmonary:     Effort: Pulmonary effort is normal. No respiratory distress.     Breath sounds: Normal breath sounds. No wheezing.  Chest:     Chest wall: No tenderness.  Abdominal:     Palpations: Abdomen is soft.     Tenderness: There is no abdominal tenderness.  Musculoskeletal:     Cervical back: Neck supple.  Skin:    General: Skin is warm and dry.  Neurological:     Mental Status: She is alert.    ED Results / Procedures / Treatments   Labs (all labs ordered are listed, but only abnormal results are displayed) Labs Reviewed  CBC WITH DIFFERENTIAL/PLATELET - Abnormal; Notable for the following components:      Result Value   WBC 17.5 (*)    Neutro Abs 14.2 (*)    Abs Immature Granulocytes 1.20 (*)    All other components within normal limits  COMPREHENSIVE METABOLIC PANEL - Abnormal; Notable for the following components:   Sodium 131 (*)    Chloride 96 (*)    Glucose, Bld 180 (*)    BUN 24 (*)    Calcium 8.4 (*)    Total Protein 6.1 (*)    Albumin 2.5  (*)    All other components within normal limits  TROPONIN I (HIGH SENSITIVITY) - Abnormal; Notable for the following components:   Troponin I (High Sensitivity) 53 (*)    All other components within normal limits  TROPONIN I (HIGH SENSITIVITY) - Abnormal; Notable for the following components:   Troponin I (High Sensitivity) 142 (*)    All other components within normal limits    EKG EKG Interpretation  Date/Time:  Monday September 13 2021 19:54:57 EDT Ventricular Rate:  83 PR Interval:  140 QRS Duration: 80 QT Interval:  384 QTC Calculation: 452 R Axis:   11 Text Interpretation: Sinus rhythm Consider left ventricular hypertrophy unchanged  Confirmed by Eber Hong ([id number]) on 09/13/2021 9:56:01 PM  Radiology CT Angio Chest PE W and/or Wo Contrast  Result Date: 09/13/2021 CLINICAL DATA:  Lung/mediastinal abscess.  Recent neck abscess. EXAM: CT ANGIOGRAPHY CHEST WITH CONTRAST TECHNIQUE: Multidetector CT imaging of the chest was performed using the standard protocol during bolus administration of intravenous contrast. Multiplanar CT image reconstructions and MIPs were obtained to evaluate the vascular anatomy. CONTRAST:  58mL OMNIPAQUE IOHEXOL 350 MG/ML SOLN COMPARISON:  CT of the neck 09/07/2021. FINDINGS: Cardiovascular: Satisfactory opacification of the pulmonary arteries to the segmental level. No evidence of pulmonary embolism. Normal heart size. No pericardial effusion. Mediastinum/Nodes: There is no evidence for abscess or acute inflammatory change. There is a 6 mm hypodense left thyroid nodule. There are no enlarged mediastinal or hilar lymph nodes identified. The esophagus is nondilated. Lungs/Pleura: There is atelectasis in the bilateral lower lobes. There is no focal lung infiltrate, pleural effusion or pneumothorax. Upper Abdomen: No acute abnormality. Musculoskeletal: No chest wall abnormality. No acute or significant osseous findings. Review of the MIP images confirms the above  findings. IMPRESSION: 1. No evidence for pulmonary embolism. 2. No acute cardiopulmonary process. No mediastinal abscess or inflammation. 3. Subcentimeter incidental left thyroid nodule. No follow-up imaging is recommended. Reference: J Am Coll Radiol. 2015 Feb;12(2): 143-50 Electronically Signed   By: Darliss Cheney M.D.   On: 09/13/2021 23:46   DG Chest Portable 1 View  Result Date: 09/13/2021 CLINICAL DATA:  Chest pain short of breath EXAM: PORTABLE CHEST 1 VIEW COMPARISON:  09/08/2021 FINDINGS: No focal opacity or pleural effusion. Cardiomediastinal silhouette within normal limits. Aortic atherosclerosis. IMPRESSION: No active disease. Electronically Signed   By: Jasmine Pang M.D.   On: 09/13/2021 20:13    Procedures Procedures   Medications Ordered in ED Medications  iohexol (OMNIPAQUE) 350 MG/ML injection 80 mL (80 mLs Intravenous Contrast Given 09/13/21 2336)  aspirin EC tablet 325 mg (325 mg Oral Given 09/14/21 0032)    ED Course  I have reviewed the triage vital signs and the nursing notes.  Pertinent labs & imaging results that were available during my care of the patient were reviewed by me and considered in my medical decision making (see chart for details).    MDM Rules/Calculators/A&P HEAR Score: 3                        Patient is hemodynamically stable and well-appearing at presentation.  Symptoms have resolved.  Items on the differential for chest pain include arrhythmia, ACS, anemia, pneumonia, pneumothorax, COVID related chest pain, PE.  Also on the differential is a possible extension of patient's prior neck infection into the mediastinal space given her white blood count has increased from 12-17.  EKG without acute ischemia or arrhythmia.  Hemoglobin is stable at 12.7 and CBC otherwise unremarkable.  CMP with sodium low at 131 but otherwise unremarkable.  Chest x-ray without pneumonia, pneumothorax, pulmonary edema, or effusion.  Initial troponin elevated at 50, and  trends upward to 142.  CTA with no PE and no evidence of extension of neck abscess or mediastinal involvement.  No other acute pulmonary abnormalities seen.  No acute ischemic changes on EKG and patient continues to be asymptomatic without chest pain or shortness of breath.  Possibly type II NSTEMI in the setting of recent complex medical admission.  Will not start heparin.  Hospitalist consulted for admission, but they request cardiology consult on whether to start anticoagulation or not.  Cards to see patient in consult, admit to medicine.   Final Clinical Impression(s) / ED Diagnoses Final diagnoses:  NSTEMI (non-ST elevated myocardial infarction) [facility])  Atypical chest pain    Rx / DC Orders ED Discharge Orders     None          Doran Clay, MD 09/14/21 0626    Eber Hong, MD 09/15/21 1144

## 2021-09-13 NOTE — Progress Notes (Signed)
Patient discharged home with patients son. Patient sent home with discharge paperwork and interpreter used for discharge education. Patient also educated on wound care and shown how to clean and change dressing as well as signs and symptoms of infection and when to seek healthcare help. Medication education also provided via translator and patient aware to finish all antibiotic and use oral mouth wash. All questions and concerns addressed with patient and patients son.

## 2021-09-13 NOTE — Discharge Summary (Signed)
Physician Discharge Summary  Shannon Stephenson AOZ:[id number] DOB: [birth year] DOA: 09/07/2021  PCP: Macy Mis, MD  Admit date: 09/07/2021 Discharge date: 09/13/2021 Admitted From: Home. Disposition: Home Recommendations for Outpatient Follow-up:  Follow ups as below. Please obtain CBC/BMP/Mag at follow up Please follow up on the following pending results: Fungal abscess culture Home Health: Not indicated Equipment/Devices: Not indicated Discharge Condition: Stable CODE STATUS: Full code  Follow-up Information     Macy Mis, MD. Schedule an appointment as soon as possible for a visit in 1 week(s).   Specialty: Family Medicine Contact information: [address] [place] [address] [phone]         Enis Slipper, DMD. Schedule an appointment as soon as possible for a visit in 2 week(s).   Specialty: Oral Surgery Why: As needed Contact information: [address] [place] [address] [phone]                Hospital Course: 80 year old F with PMH of diet-controlled DM-2, HTN, spinal stenosis and osteoporosis presenting with 3 days of progressive oral pain and submandibular and sublingual swelling following dental extraction on 09/06/2021.  Incidentally tested positive for COVID-19.  CT soft tissue neck with large abscess of the sublingual space with extension into the left submandibular space but without airway stenosis.  She was started on IV steroid, Unasyn and clindamycin.  Nasally intubated for airway protection. Underwent I&D with Penrose drain placement by Dr. Ross Marcus on 09/08/2021.  Extubated on 09/10/2021.  ID consulted and stopped clindamycin.    Transferred to [facility] service on room air. Abscess culture with Streptococcus mitis/paralysis with resistant to clindamycin and intermediate resistance to penicillin.  ID recommended a dose of oritavancin and continuing Augmentin for 2 weeks on discharge as previously  planned.  On the day of discharge, patient felt well.  Penrose drains removed.  Cleared for discharge by oral surgery on antibiotics and Peridex for outpatient follow-up as needed.  See individual problem list below for more on hospital course.  Discharge Diagnoses:  Sublingual, submandibular and submental abscesses s/p I&D and Penrose drain placement: Improving -Culture data as above. -9/27-Unasyn>10/3> Augmentin for 2 more weeks -9/29-10/1-IV clindamycin.  -10/2-oritavancin per ID -Peridex 4 times daily -Tylenol as needed for pain -Dry gauze dressing as needed -Outpatient follow-up with oral surgery as needed   Dysphagia due to the above -On dysphagia 3 diet per SLP   Essential HTN: SBP slightly elevated. -Resume home HCTZ   Diet controlled DM-2: A1c 6.8%.  Does not seem to be on medication for this. Recent Labs  Lab 09/12/21 0720 09/12/21 1124 09/12/21 1533 09/12/21 2224 09/13/21 0730  GLUCAP 103* 157* 192* 227* 125*  -Consider low-dose metformin outpatient   Hx of osteoporosis. -Continue home Fosamax.  Can resume on discharge.   Anemia of critical illness: H&H stable.   Leukocytosis/bandemia: Likely due to infection and steroid.  Improved. -Recheck CBC at follow-up  Body mass index is 26.78 kg/m. Nutrition Problem: Inadequate oral intake Etiology: inability to eat Signs/Symptoms: NPO status Interventions: Refer to RD note for recommendations     Discharge Exam: Vitals:   09/13/21 0830 09/13/21 0900 09/13/21 0930 09/13/21 1000  BP: (!) 163/72 (!) 142/53 (!) 118/54 127/66  Pulse: 85 80 75 84  Temp:      Resp: 19 14 15 14   Height:      Weight:      SpO2: 99% 99% 98% 98%  TempSrc:  GENERAL: No apparent distress.  Nontoxic. HEENT: MMM.  Vision and hearing grossly intact.  NECK: Some residual swelling under her chins RESP: 98% on RA.  No IWOB.  Fair aeration bilaterally. CVS:  RRR. Heart sounds normal.  ABD/GI/GU: Bowel sounds present. Soft.  Non tender.  MSK/EXT:  Moves extremities. No apparent deformity. No edema.  SKIN: no apparent skin lesion or wound other than small skin wounds from penrose drain NEURO: Awake and alert.  Oriented appropriately.  No apparent focal neuro deficit. PSYCH: Calm. Normal affect.   Discharge Instructions  Discharge Instructions     Call MD for:  difficulty breathing, headache or visual disturbances   Complete by: As directed    Call MD for:  extreme fatigue   Complete by: As directed    Call MD for:  redness, tenderness, or signs of infection (pain, swelling, redness, odor or green/yellow discharge around incision site)   Complete by: As directed    Call MD for:  severe uncontrolled pain   Complete by: As directed    Call MD for:  temperature >100.4   Complete by: As directed    Diet - low sodium heart healthy   Complete by: As directed    Mechanically soft diet   Discharge instructions   Complete by: As directed    It has been a pleasure taking care of you!  You were hospitalized due to infection and abscess for which you were treated surgically medically.  We are discharging you on antibiotics and mouth wash for the next 2 weeks.  It is very important that you complete the whole course of antibiotic regardless of how you feel.  You may follow-up with your dental surgeon in about 2 weeks as needed.  Please review your new medication list and the directions on your medications before you take them.  In regards to your COVID-19 infection, you have completed your 5 days of isolation precaution as recommended by CDC but we suggest taking precautions for another 5 days by wearing mask and using appropriate hand sanitizer   Take care,   Discharge wound care:   Complete by: As directed    -Change Kerlix dressing as needed, can switch to taped gauze over incisions   Increase activity slowly   Complete by: As directed       Allergies as of 09/13/2021   No Known Allergies      Medication  List     STOP taking these medications    amoxicillin 500 MG tablet Commonly known as: AMOXIL       TAKE these medications    acetaminophen 500 MG tablet Commonly known as: TYLENOL Take 500 mg by mouth every 6 (six) hours as needed for pain.   alendronate 70 MG tablet Commonly known as: FOSAMAX Take 70 mg by mouth once a week.   amoxicillin-clavulanate 875-125 MG tablet Commonly known as: Augmentin Take 1 tablet by mouth 2 (two) times daily for 13 days.   chlorhexidine 0.12 % solution Commonly known as: PERIDEX Use as directed 15 mLs in the mouth or throat 4 (four) times daily.   conjugated estrogens 0.625 MG/GM vaginal cream Commonly known as: PREMARIN Place 1 Applicatorful vaginally daily.   hydrochlorothiazide 25 MG tablet Commonly known as: HYDRODIURIL Take 25 mg by mouth every morning.   ibuprofen 600 MG tablet Commonly known as: ADVIL Take 600 mg by mouth every 6 (six) hours as needed for pain.   traMADol 50 MG tablet Commonly known as: Ultram  Take 1 tablet (50 mg total) by mouth every 6 (six) hours as needed for pain.               Discharge Care Instructions  (From admission, onward)           Start     Ordered   09/13/21 0000  Discharge wound care:       Comments: -Change Kerlix dressing as needed, can switch to taped gauze over incisions   09/13/21 0805            Consultations: Oral surgery Infectious disease Pulmonology  Procedures/Studies: 9/28-I&D of sublingual/submandibular abscess with Penrose drain placement by Dr. [place] Orthopantogram  Result Date: 09/08/2021 CLINICAL DATA:  Swelling EXAM: ORTHOPANTOGRAM/PANORAMIC 1 view COMPARISON:  None. FINDINGS: Dental amalgam. No periapical lucencies identified. The mandible demonstrates normal alignment without acute osseous abnormality. IMPRESSION: No acute abnormality. No radiographic findings of a periodontal abscess. Electronically Signed   By: Olive Bass M.D.    On: 09/08/2021 10:55   CT Soft Tissue Neck W Contrast  Result Date: 09/07/2021 CLINICAL DATA:  Throat swelling and dental disease. EXAM: CT NECK WITH CONTRAST TECHNIQUE: Multidetector CT imaging of the neck was performed using the standard protocol following the bolus administration of intravenous contrast. CONTRAST:  39mL OMNIPAQUE IOHEXOL 350 MG/ML SOLN COMPARISON:  None. FINDINGS: Pharynx and larynx: There is a large abscess of sublingual space that measures 4.4 x 1.3 cm. There is multifocal internal gas. There is a component of the abscess extending into the left submandibular space. The retropharyngeal space is clear. The nasopharynx is normal. There is no airway stenosis. Normal epiglottis. Salivary glands: Negative Thyroid: Normal Lymph nodes: None enlarged or abnormal density. Vascular: Negative. Limited intracranial: Negative. Visualized orbits: Negative. Mastoids and visualized paranasal sinuses: Clear. Skeleton: No acute or aggressive process. Upper chest: Negative. Other: None. IMPRESSION: 1. Large abscess of the sublingual space with extension into the left submandibular space. 2. No airway stenosis. Electronically Signed   By: Deatra Robinson M.D.   On: 09/07/2021 22:36   DG CHEST PORT 1 VIEW  Result Date: 09/08/2021 CLINICAL DATA:  Intubated EXAM: PORTABLE CHEST 1 VIEW COMPARISON:  04/06/2021 FINDINGS: Single frontal view of the chest demonstrates an unremarkable cardiac silhouette. Endotracheal tube overlies tracheal air column tip at level of thoracic inlet. Linear consolidation at the left lung base likely reflects atelectasis. No airspace disease, effusion, or pneumothorax. IMPRESSION: 1. No complications after intubation. 2. Likely hypoventilatory changes left lung base. Electronically Signed   By: Sharlet Salina M.D.   On: 09/08/2021 21:39       The results of significant diagnostics from this hospitalization (including imaging, microbiology, ancillary and laboratory) are listed below  for reference.     Microbiology: Recent Results (from the past 240 hour(s))  Culture, blood (routine x 2)     Status: None   Collection Time: 09/07/21 10:10 PM   Specimen: BLOOD LEFT ARM  Result Value Ref Range Status   Specimen Description BLOOD LEFT ARM  Final   Special Requests   Final    BOTTLES DRAWN AEROBIC AND ANAEROBIC Blood Culture adequate volume   Culture   Final    NO GROWTH 5 DAYS Performed at [facility], [address] [place], [place] [address]    Report Status 09/12/2021 FINAL  Final  Culture, blood (routine x 2)     Status: None   Collection Time: 09/07/21 10:30 PM   Specimen: BLOOD RIGHT ARM  Result Value Ref Range Status   Specimen Description BLOOD RIGHT ARM  Final   Special Requests   Final    BOTTLES DRAWN AEROBIC AND ANAEROBIC Blood Culture adequate volume   Culture   Final    NO GROWTH 5 DAYS Performed at [facility], [address], [place], [place] [address]    Report Status 09/12/2021 FINAL  Final  Resp Panel by RT-PCR (Flu A&B, Covid) Nasopharyngeal Swab     Status: Abnormal   Collection Time: 09/07/21 10:46 PM   Specimen: Nasopharyngeal Swab; Nasopharyngeal(NP) swabs in vial transport medium  Result Value Ref Range Status   SARS Coronavirus 2 by RT PCR POSITIVE (A) NEGATIVE Final    Comment: RESULT CALLED TO, READ BACK BY AND VERIFIED WITH: RN ROBERT S. 09/08/21@00 :06 BY TW (NOTE) SARS-CoV-2 target nucleic acids are DETECTED.  The SARS-CoV-2 RNA is generally detectable in upper respiratory specimens during the acute phase of infection. Positive results are indicative of the presence of the identified virus, but do not rule out bacterial infection or co-infection with other pathogens not detected by the test. Clinical correlation with patient history and other diagnostic information is necessary to determine patient infection status. The expected result is Negative.  Fact Sheet for  Patients: BloggerCourse.com  Fact Sheet for Healthcare Providers: SeriousBroker.it  This test is not yet approved or cleared by the [place] FDA and  has been authorized for detection and/or diagnosis of SARS-CoV-2 by FDA under an Emergency Use Authorization (EUA).  This EUA will remain in effect (meaning this test can be  used) for the duration of  the COVID-19 declaration under Section 564(b)(1) of the Act, 21 U.S.C. section 360bbb-3(b)(1), unless the authorization is terminated or revoked sooner.     Influenza A by PCR NEGATIVE NEGATIVE Final   Influenza B by PCR NEGATIVE NEGATIVE Final    Comment: (NOTE) The Xpert Xpress SARS-CoV-2/FLU/RSV plus assay is intended as an aid in the diagnosis of influenza from Nasopharyngeal swab specimens and should not be used as a sole basis for treatment. Nasal washings and aspirates are unacceptable for Xpert Xpress SARS-CoV-2/FLU/RSV testing.  Fact Sheet for Patients: BloggerCourse.com  Fact Sheet for Healthcare Providers: SeriousBroker.it  This test is not yet approved or cleared by the [place] FDA and has been authorized for detection and/or diagnosis of SARS-CoV-2 by FDA under an Emergency Use Authorization (EUA). This EUA will remain in effect (meaning this test can be used) for the duration of the COVID-19 declaration under Section 564(b)(1) of the Act, 21 U.S.C. section 360bbb-3(b)(1), unless the authorization is terminated or revoked.  Performed at [facility], [address], [place], [place] [address]   Fungus Culture With Stain     Status: None (Preliminary result)   Collection Time: 09/08/21  7:28 PM   Specimen: Neck, Left; Abscess  Result Value Ref Range Status   Fungus Stain Final report  Final    Comment: (NOTE) Performed At: [facility] [address] [place], [place] [id number] Jolene Schimke MD LE:[id number]    Fungus (Mycology) Culture PENDING  Incomplete   Fungal Source LEFT NECK SPEC A  Final    Comment: Performed at [facility], [address] [place], [place] [address]  Aerobic/Anaerobic Culture w Gram Stain (surgical/deep wound)     Status: None   Collection Time: 09/08/21  7:28 PM   Specimen: Neck, Left; Abscess  Result Value Ref Range Status   Specimen Description ABSCESS  Final   Special Requests LEFT NECK SPEC A  Final   Gram Stain   Final    FEW SQUAMOUS EPITHELIAL CELLS PRESENT FEW WBC PRESENT, PREDOMINANTLY MONONUCLEAR MODERATE GRAM POSITIVE COCCI FEW GRAM NEGATIVE RODS FEW GRAM POSITIVE RODS    Culture   Final    RARE STREPTOCOCCUS CONSTELLATUS RARE STREPTOCOCCUS MITIS/ORALIS RARE CARDIOBACTERIUM HOMINIS Standardized susceptibility testing for this organism is not available. RARE PREVOTELLA DENTICOLA BETA LACTAMASE POSITIVE Performed at [facility], [address] [place], [place] [address]    Report Status 09/13/2021 FINAL  Final   Organism ID, Bacteria STREPTOCOCCUS MITIS/ORALIS  Final   Organism ID, Bacteria STREPTOCOCCUS CONSTELLATUS  Final      Susceptibility   Streptococcus constellatus - MIC*    PENICILLIN <=0.06 SENSITIVE Sensitive     CEFTRIAXONE 0.5 SENSITIVE Sensitive     ERYTHROMYCIN 4 RESISTANT Resistant     LEVOFLOXACIN 0.5 SENSITIVE Sensitive     VANCOMYCIN 0.5 SENSITIVE Sensitive     * RARE STREPTOCOCCUS CONSTELLATUS   Streptococcus mitis/oralis - MIC*    TETRACYCLINE 4 SENSITIVE Sensitive     VANCOMYCIN 0.5 SENSITIVE Sensitive     CLINDAMYCIN >=1 RESISTANT Resistant     PENICILLIN Value in next row Intermediate      INTERMEDIATE1    CEFTRIAXONE Value in next row Intermediate      INTERMEDIATE2    * RARE STREPTOCOCCUS MITIS/ORALIS  Fungus Culture Result     Status: None   Collection Time: 09/08/21  7:28 PM  Result Value Ref Range Status   Result 1 Comment  Final    Comment: (NOTE) KOH/Calcofluor  preparation:  no fungus observed. Performed At: [facility] [address] [place], [place] [id number] Jolene Schimke MD IO:[id number]   MRSA Next Gen by PCR, Nasal     Status: None   Collection Time: 09/08/21  9:09 PM   Specimen: Nasal Mucosa; Nasal Swab  Result Value Ref Range Status   MRSA by PCR Next Gen NOT DETECTED NOT DETECTED Final    Comment: (NOTE) The GeneXpert MRSA Assay (FDA approved for NASAL specimens only), is one component of a comprehensive MRSA colonization surveillance program. It is not intended to diagnose MRSA infection nor to guide or monitor treatment for MRSA infections. Test performance is not FDA approved in patients less than 47 years old. Performed at [facility], [address], [place], [place] [address]      Labs:  CBC: Recent Labs  Lab 09/07/21 1950 09/08/21 0448 09/08/21 2346 09/09/21 0626 09/11/21 0206 09/12/21 0610  WBC 17.7* 19.1*  --  13.5* 11.6* 12.2*  NEUTROABS 16.5*  --   --  12.3*  --   --   HGB 12.2 12.4 10.9* 11.9* 10.4* 12.3  HCT 38.3 39.0 32.0* 37.0 31.8* 38.2  MCV 95.3 96.3  --  94.4 91.9 93.6  PLT 210 215  --  239 244 295   BMP &GFR Recent Labs  Lab 09/07/21 1950 09/08/21 0448 09/08/21 2346 09/09/21 0626 09/11/21 0206 09/12/21 0610  NA 136 137 140 141 139 138  K 3.8 4.6 3.6 3.8 3.7 4.2  CL 104 104  --  104 105 100  CO2 22 25  --  GLUCOSE 226* 195*  --  220* 73 122*  BUN 17 17  --  29* 28* 22  CREATININE 0.92 0.82  --  0.94 0.81 0.80  CALCIUM 8.6* 8.7*  --  8.3* 7.9* 8.4*  Estimated Creatinine Clearance: 49 mL/min (by C-G formula based on SCr of 0.8 mg/dL). Liver & Pancreas: Recent Labs  Lab 09/08/21 0448  AST 51*  ALT 58*  ALKPHOS 102  BILITOT 0.8  PROT 6.5  ALBUMIN 2.6*   No results for input(s): LIPASE, AMYLASE in the last 168 hours. No results for input(s): AMMONIA in the last 168 hours. Diabetic: No results for input(s): HGBA1C in the last 72 hours. Recent Labs   Lab 09/12/21 0720 09/12/21 1124 09/12/21 1533 09/12/21 2224 09/13/21 0730  GLUCAP 103* 157* 192* 227* 125*   Cardiac Enzymes: No results for input(s): CKTOTAL, CKMB, CKMBINDEX, TROPONINI in the last 168 hours. No results for input(s): PROBNP in the last 8760 hours. Coagulation Profile: No results for input(s): INR, PROTIME in the last 168 hours. Thyroid Function Tests: No results for input(s): TSH, T4TOTAL, FREET4, T3FREE, THYROIDAB in the last 72 hours. Lipid Profile: No results for input(s): CHOL, HDL, LDLCALC, TRIG, CHOLHDL, LDLDIRECT in the last 72 hours. Anemia Panel: No results for input(s): VITAMINB12, FOLATE, FERRITIN, TIBC, IRON, RETICCTPCT in the last 72 hours. Urine analysis:    Component Value Date/Time   COLORURINE YELLOW 04/06/2021 2220   APPEARANCEUR CLEAR 04/06/2021 2220   LABSPEC 1.013 04/06/2021 2220   PHURINE 6.0 04/06/2021 2220   GLUCOSEU NEGATIVE 04/06/2021 2220   HGBUR NEGATIVE 04/06/2021 2220   BILIRUBINUR NEGATIVE 04/06/2021 2220   KETONESUR NEGATIVE 04/06/2021 2220   PROTEINUR NEGATIVE 04/06/2021 2220   UROBILINOGEN 0.2 08/13/2013 2022   NITRITE NEGATIVE 04/06/2021 2220   LEUKOCYTESUR NEGATIVE 04/06/2021 2220   Sepsis Labs: Invalid input(s): PROCALCITONIN, LACTICIDVEN   Time coordinating discharge: 45 minutes  SIGNED:  Almon Hercules, MD  [facility] 09/13/2021, 4:26 PM

## 2021-09-14 ENCOUNTER — Observation Stay (HOSPITAL_BASED_OUTPATIENT_CLINIC_OR_DEPARTMENT_OTHER): Payer: Medicare Other

## 2021-09-14 DIAGNOSIS — I214 Non-ST elevation (NSTEMI) myocardial infarction: Secondary | ICD-10-CM | POA: Insufficient documentation

## 2021-09-14 DIAGNOSIS — R079 Chest pain, unspecified: Secondary | ICD-10-CM | POA: Diagnosis not present

## 2021-09-14 DIAGNOSIS — R7989 Other specified abnormal findings of blood chemistry: Secondary | ICD-10-CM | POA: Diagnosis not present

## 2021-09-14 DIAGNOSIS — U071 COVID-19: Secondary | ICD-10-CM

## 2021-09-14 DIAGNOSIS — R0789 Other chest pain: Secondary | ICD-10-CM | POA: Insufficient documentation

## 2021-09-14 DIAGNOSIS — D72829 Elevated white blood cell count, unspecified: Secondary | ICD-10-CM

## 2021-09-14 LAB — CBC
HCT: 33.1 % — ABNORMAL LOW (ref 36.0–46.0)
Hemoglobin: 10.8 g/dL — ABNORMAL LOW (ref 12.0–15.0)
MCH: 30.3 pg (ref 26.0–34.0)
MCHC: 32.6 g/dL (ref 30.0–36.0)
MCV: 93 fL (ref 80.0–100.0)
Platelets: 305 10*3/uL (ref 150–400)
RBC: 3.56 MIL/uL — ABNORMAL LOW (ref 3.87–5.11)
RDW: 12.3 % (ref 11.5–15.5)
WBC: 13.3 10*3/uL — ABNORMAL HIGH (ref 4.0–10.5)
nRBC: 0 % (ref 0.0–0.2)

## 2021-09-14 LAB — URINALYSIS, COMPLETE (UACMP) WITH MICROSCOPIC
Bacteria, UA: NONE SEEN
Bilirubin Urine: NEGATIVE
Glucose, UA: NEGATIVE mg/dL
Hgb urine dipstick: NEGATIVE
Ketones, ur: NEGATIVE mg/dL
Leukocytes,Ua: NEGATIVE
Nitrite: NEGATIVE
Protein, ur: NEGATIVE mg/dL
Specific Gravity, Urine: 1.02 (ref 1.005–1.030)
pH: 8 (ref 5.0–8.0)

## 2021-09-14 LAB — LIPID PANEL
Cholesterol: 190 mg/dL (ref 0–200)
Cholesterol: 190 mg/dL (ref 0–200)
HDL: 32 mg/dL — ABNORMAL LOW (ref >40)
HDL: 31 mg/dL — ABNORMAL LOW (ref >40)
LDL Cholesterol: 125 mg/dL — ABNORMAL HIGH (ref 0–99)
LDL Cholesterol: 130 mg/dL — ABNORMAL HIGH (ref 0–99)
Total CHOL/HDL Ratio: 5.9 RATIO
Total CHOL/HDL Ratio: 6.1 RATIO
Triglycerides: 163 mg/dL — ABNORMAL HIGH (ref <150)
Triglycerides: 144 mg/dL (ref <150)
VLDL: 33 mg/dL (ref 0–40)
VLDL: 29 mg/dL (ref 0–40)

## 2021-09-14 LAB — ECHOCARDIOGRAM COMPLETE
Area-P 1/2: 2.69 cm2
Calc EF: 78.3 %
S' Lateral: 2 cm
Single Plane A2C EF: 77 %
Single Plane A4C EF: 79.2 %

## 2021-09-14 LAB — BASIC METABOLIC PANEL
Anion gap: 6 (ref 5–15)
BUN: 19 mg/dL (ref 8–23)
CO2: 25 mmol/L (ref 22–32)
Calcium: 7 mg/dL — ABNORMAL LOW (ref 8.9–10.3)
Chloride: 103 mmol/L (ref 98–111)
Creatinine, Ser: 0.7 mg/dL (ref 0.44–1.00)
GFR, Estimated: >60 mL/min (ref >60)
Glucose, Bld: 115 mg/dL — ABNORMAL HIGH (ref 70–99)
Potassium: 3.2 mmol/L — ABNORMAL LOW (ref 3.5–5.1)
Sodium: 134 mmol/L — ABNORMAL LOW (ref 135–145)

## 2021-09-14 LAB — TROPONIN I (HIGH SENSITIVITY)
Troponin I (High Sensitivity): 211 ng/L (ref <18)
Troponin I (High Sensitivity): 143 ng/L (ref <18)
Troponin I (High Sensitivity): 118 ng/L (ref <18)

## 2021-09-14 MED ORDER — ACETAMINOPHEN 650 MG RE SUPP: 650.0000 mg | Freq: Four times a day (QID) | RECTAL | Status: DC | PRN

## 2021-09-14 MED ORDER — AMOXICILLIN-POT CLAVULANATE 875-125 MG PO TABS
1.0000 | ORAL_TABLET | Freq: Two times a day (BID) | ORAL | Status: DC
  Administered 2021-09-14 – 2021-09-15 (×3): 1 via ORAL
  Filled 2021-09-14 (×3): qty 1

## 2021-09-14 MED ORDER — SODIUM CHLORIDE 0.9 % IV SOLN: INTRAVENOUS | Status: AC

## 2021-09-14 MED ORDER — ASPIRIN EC 325 MG PO TBEC
325.0000 mg | DELAYED_RELEASE_TABLET | Freq: Once | ORAL | Status: AC
  Administered 2021-09-14: 325 mg via ORAL
  Filled 2021-09-14: qty 1

## 2021-09-14 MED ORDER — ACETAMINOPHEN 325 MG PO TABS: 650.0000 mg | ORAL_TABLET | Freq: Four times a day (QID) | ORAL | Status: DC | PRN

## 2021-09-14 MED ORDER — CHLORHEXIDINE GLUCONATE 0.12 % MT SOLN
15.0000 mL | Freq: Four times a day (QID) | OROMUCOSAL | Status: DC
  Administered 2021-09-14: 15 mL via OROMUCOSAL
  Filled 2021-09-14 (×6): qty 15

## 2021-09-14 MED ORDER — HEPARIN SODIUM (PORCINE) 5000 UNIT/ML IJ SOLN
5000.0000 [IU] | Freq: Three times a day (TID) | INTRAMUSCULAR | Status: DC
  Administered 2021-09-14 – 2021-09-15 (×4): 5000 [IU] via SUBCUTANEOUS
  Filled 2021-09-14 (×4): qty 1

## 2021-09-14 NOTE — ED Notes (Signed)
Tray ordered.

## 2021-09-14 NOTE — Progress Notes (Signed)
  Echocardiogram 2D Echocardiogram has been performed.  Shannon Stephenson 09/14/2021, 2:13 PM

## 2021-09-14 NOTE — Progress Notes (Signed)
Progress Note  Patient Name: Shannon Stephenson Date of Encounter: 09/14/2021  [facility] Cardiologist: None   Subjective   Shannon Stephenson is chest pain free. She is feeling well. She notes no history of CVD or smoking. She is [place] speaking and conversation was via the interpreter Koren Bound) in the room. She is COVID +  Inpatient Medications    Scheduled Meds:  amoxicillin-clavulanate  1 tablet Oral BID   chlorhexidine  15 mL Mouth/Throat QID   heparin  5,000 Units Subcutaneous Q8H   Continuous Infusions:  sodium chloride 75 mL/hr at 09/14/21 0436   PRN Meds: acetaminophen **OR** acetaminophen   Vital Signs    Vitals:   09/14/21 0400 09/14/21 0500 09/14/21 0515 09/14/21 0700  BP: (!) 124/57 (!) 116/49  (!) 112/56  Pulse: 74 70 69 76  Resp: 11  11 14   Temp:      SpO2: 100% 100% 100% 100%   No intake or output data in the 24 hours ending 09/14/21 0938 Last 3 Weights 09/08/2021 03/01/2017 02/08/2017  Weight (lbs) 141 lb 12.1 oz 159 lb 157 lb 8 oz  Weight (kg) 64.3 kg 72.122 kg 71.442 kg      Telemetry    Sinus rhythm, no ischemic ST-T changes- Personally Reviewed  ECG     NSR, no ischemic ST-T changes, Qtc 452- Personally Reviewed  Physical Exam   GEN: No acute distress.   Neck: No JVD Cardiac: RRR, no murmurs, rubs, or gallops.  Respiratory: Clear to auscultation bilaterally. GI: Soft, nontender, non-distended  MS: No edema; No deformity. Neuro:  Nonfocal  Skin: Warm Vasc: 2+ pulses Psych: Normal affect   Labs    High Sensitivity Troponin:   Recent Labs  Lab 09/13/21 1940 09/13/21 2228 09/14/21 0430  TROPONINIHS 53* 142* 211*     Chemistry Recent Labs  Lab 09/08/21 0448 09/08/21 2346 09/12/21 0610 09/13/21 1940 09/14/21 0430  NA 137   < > 138 131* 134*  K 4.6   < > 4.2 4.2 3.2*  CL 104   < > 100 96* 103  CO2 25   < > 29 26 25   GLUCOSE 195*   < > 122* 180* 115*  BUN 17   < > 22 24* 19  CREATININE 0.82   < > 0.80 0.89 0.70  CALCIUM  8.7*   < > 8.4* 8.4* 7.0*  PROT 6.5  --   --  6.1*  --   ALBUMIN 2.6*  --   --  2.5*  --   AST 51*  --   --  32  --   ALT 58*  --   --  36  --   ALKPHOS 102  --   --  60  --   BILITOT 0.8  --   --  0.8  --   GFRNONAA >60   < > >60 >60 >60  ANIONGAP 8   < > 9 9 6    < > = values in this interval not displayed.    Lipids  Recent Labs  Lab 09/09/21 0626  TRIG 171*    Hematology Recent Labs  Lab 09/12/21 0610 09/13/21 1940 09/14/21 0430  WBC 12.2* 17.5* 13.3*  RBC 4.08 4.18 3.56*  HGB 12.3 12.7 10.8*  HCT 38.2 41.1 33.1*  MCV 93.6 98.3 93.0  MCH 30.1 30.4 30.3  MCHC 32.2 30.9 32.6  RDW 12.4 12.3 12.3  PLT 295 304 305   Thyroid No results for input(s): TSH, FREET4  in the last 168 hours.  BNPNo results for input(s): BNP, PROBNP in the last 168 hours.  DDimer No results for input(s): DDIMER in the last 168 hours.   Radiology    CT Angio Chest PE W and/or Wo Contrast  Result Date: 09/13/2021 CLINICAL DATA:  Lung/mediastinal abscess.  Recent neck abscess. EXAM: CT ANGIOGRAPHY CHEST WITH CONTRAST TECHNIQUE: Multidetector CT imaging of the chest was performed using the standard protocol during bolus administration of intravenous contrast. Multiplanar CT image reconstructions and MIPs were obtained to evaluate the vascular anatomy. CONTRAST:  45mL OMNIPAQUE IOHEXOL 350 MG/ML SOLN COMPARISON:  CT of the neck 09/07/2021. FINDINGS: Cardiovascular: Satisfactory opacification of the pulmonary arteries to the segmental level. No evidence of pulmonary embolism. Normal heart size. No pericardial effusion. Mediastinum/Nodes: There is no evidence for abscess or acute inflammatory change. There is a 6 mm hypodense left thyroid nodule. There are no enlarged mediastinal or hilar lymph nodes identified. The esophagus is nondilated. Lungs/Pleura: There is atelectasis in the bilateral lower lobes. There is no focal lung infiltrate, pleural effusion or pneumothorax. Upper Abdomen: No acute abnormality.  Musculoskeletal: No chest wall abnormality. No acute or significant osseous findings. Review of the MIP images confirms the above findings. IMPRESSION: 1. No evidence for pulmonary embolism. 2. No acute cardiopulmonary process. No mediastinal abscess or inflammation. 3. Subcentimeter incidental left thyroid nodule. No follow-up imaging is recommended. Reference: J Am Coll Radiol. 2015 Feb;12(2): 143-50 Electronically Signed   By: Darliss Cheney M.D.   On: 09/13/2021 23:46   DG Chest Portable 1 View  Result Date: 09/13/2021 CLINICAL DATA:  Chest pain short of breath EXAM: PORTABLE CHEST 1 VIEW COMPARISON:  09/08/2021 FINDINGS: No focal opacity or pleural effusion. Cardiomediastinal silhouette within normal limits. Aortic atherosclerosis. IMPRESSION: No active disease. Electronically Signed   By: Jasmine Pang M.D.   On: 09/13/2021 20:13    Cardiac Studies   Personally reviewed the echocardiogram LVEF hyperdynamic Mild LVH No RWMA Mild AI   Patient Profile     80 y.o. female diet-controlled type 2 diabetes, hypertension, chronic back pain who presented with atypical right sided chest pain, recent tooth abscess and COVID19 infection  Assessment & Plan   #Atypical Angina: p/w right sided chest pain after eating soup. She had no ST-T changes. Troponin had a mild delta. She is asymptomatic currently. Her risk factors for coronary disease include age and DMII (A1c 6.8). She has no CVD history.  She is COVID+ and troponin elevation may be in the setting of myocarditis, she has no signs of heart failure. Her troponin down trended. --> very low concern for ACS, no plan for ischemic eval --> continue COVID management --> continue asa 81 mg --> we will sign off unless there is a change to her clinical status   For questions or updates, please contact [facility] Please consult [email or web address] for contact info under        Signed, Maisie Fus, MD  09/14/2021, 9:38 AM

## 2021-09-14 NOTE — Consult Note (Signed)
Cardiology Consultation:   Patient ID: Shannon Stephenson MRN: [id number]; DOB: January 08, 1941  Admit date: 09/13/2021 Date of Consult: 09/14/2021  PCP:  Macy Mis, MD   [facility] Providers Cardiologist:  None        Patient Profile:   Shannon Stephenson is a 80 y.o. female with a hx of diabetes, hypertension, chronic backpain who is being seen 09/14/2021 for the evaluation of chest pain at the request of Dr. Loney Loh.  History of Present Illness:   Shannon Stephenson had a dental extraction 9/26 complicated by a sublingual/submandibular abscess reequiring intubation and IND with drain 9/28.  Hospitalization was complicated by COVID19.  Discharged on augmentin yesterday and represented.  She reports that, after leaving the hospital she had some soup and developed right sided chest pain following it with some associated shortness of breath.  The pain resolved uneventfully and she has no ongoing chest pain.  She thought she ahd some shortness of breath during the episode but this was brief and is not ongoing.  EKG reviewed with NSR, normal ECG but poor baseline.  Troponin 53 --> 142 --> 211  Past Medical History:  Diagnosis Date   Back pain    Diabetes mellitus without complication (HCC)    Hypertension     Past Surgical History:  Procedure Laterality Date   INCISION AND DRAINAGE ABSCESS N/A 09/08/2021   Procedure: INCISION AND DRAINAGE NECK ABSCESS;  Surgeon: Enis Slipper, DMD;  Location: [facility] OR;  Service: Oral Surgery;  Laterality: N/A;   LUMBAR LAMINECTOMY/DECOMPRESSION MICRODISCECTOMY Bilateral 10/09/2013   Procedure: MICRO LUMBAR DECOMPRESSION L3-L5 (2 LEVELS);  Surgeon: Javier Docker, MD;  Location: [facility] ORS;  Service: Orthopedics;  Laterality: Bilateral;   NO PAST SURGERIES         Inpatient Medications: Scheduled Meds:  amoxicillin-clavulanate  1 tablet Oral BID   chlorhexidine  15 mL Mouth/Throat QID   heparin  5,000 Units Subcutaneous Q8H   Continuous Infusions:   sodium chloride 75 mL/hr at 09/14/21 0436   PRN Meds: acetaminophen **OR** acetaminophen  Allergies:   No Known Allergies  Social History:   Social History   Socioeconomic History   Marital status: Divorced    Spouse name: Not on file   Number of children: Not on file   Years of education: Not on file   Highest education level: Not on file  Occupational History   Not on file  Tobacco Use   Smoking status: Never   Smokeless tobacco: Never  Substance and Sexual Activity   Alcohol use: No   Drug use: No   Sexual activity: Not on file  Other Topics Concern   Not on file  Social History Narrative   Not on file   Social Determinants of Health   Financial Resource Strain: Not on file  Food Insecurity: Not on file  Transportation Needs: Not on file  Physical Activity: Not on file  Stress: Not on file  Social Connections: Not on file  Intimate Partner Violence: Not on file    Family History:   Family History  Problem Relation Age of Onset   Breast cancer Neg Hx      ROS:  Please see the history of present illness.  All other ROS reviewed and negative.     Physical Exam/Data:   Vitals:   09/14/21 0300 09/14/21 0400 09/14/21 0500 09/14/21 0515  BP: (!) 106/53 (!) 124/57 (!) 116/49   Pulse: 77 74 70 69  Resp: 11 11  11  Temp:      SpO2: 100% 100% 100% 100%   No intake or output data in the 24 hours ending 09/14/21 0619 Last 3 Weights 09/08/2021 03/01/2017 02/08/2017  Weight (lbs) 141 lb 12.1 oz 159 lb 157 lb 8 oz  Weight (kg) 64.3 kg 72.122 kg 71.442 kg     There is no height or weight on file to calculate BMI.  General:  Well nourished, well developed, in no acute distress HEENT: sequelae of mandibular drain Neck: no JVD Vascular: No carotid bruits; Distal pulses 2+ bilaterally Cardiac:  normal S1, S2; RRR; no murmur  Lungs:  clear to auscultation bilaterally, no wheezing, rhonchi or rales  Abd: soft, nontender, no hepatomegaly  Ext: no  edema Musculoskeletal:  No deformities, BUE and BLE strength normal and equal Skin: warm and dry  Neuro:  CNs 2-12 intact, no focal abnormalities noted Psych:  Normal affect   EKG:  The EKG was personally reviewed and demonstrates:   Normal sinus rhythm, normal ECG  Telemetry:  Telemetry was personally reviewed and demonstrates: Normal sinus rhythm  Relevant CV Studies: Pending echo  Laboratory Data:  High Sensitivity Troponin:   Recent Labs  Lab 09/13/21 1940 09/13/21 2228 09/14/21 0430  TROPONINIHS 53* 142* 211*     Chemistry Recent Labs  Lab 09/12/21 0610 09/13/21 1940 09/14/21 0430  NA 138 131* 134*  K 4.2 4.2 3.2*  CL 100 96* 103  CO2 29 26 25   GLUCOSE 122* 180* 115*  BUN 22 24* 19  CREATININE 0.80 0.89 0.70  CALCIUM 8.4* 8.4* 7.0*  GFRNONAA >60 >60 >60  ANIONGAP 9 9 6     Recent Labs  Lab 09/08/21 0448 09/13/21 1940  PROT 6.5 6.1*  ALBUMIN 2.6* 2.5*  AST 51* 32  ALT 58* 36  ALKPHOS 102 60  BILITOT 0.8 0.8   Lipids  Recent Labs  Lab 09/09/21 0626  TRIG 171*    Hematology Recent Labs  Lab 09/12/21 0610 09/13/21 1940 09/14/21 0430  WBC 12.2* 17.5* 13.3*  RBC 4.08 4.18 3.56*  HGB 12.3 12.7 10.8*  HCT 38.2 41.1 33.1*  MCV 93.6 98.3 93.0  MCH 30.1 30.4 30.3  MCHC 32.2 30.9 32.6  RDW 12.4 12.3 12.3  PLT 295 304 305   Thyroid No results for input(s): TSH, FREET4 in the last 168 hours.  BNPNo results for input(s): BNP, PROBNP in the last 168 hours.  DDimer No results for input(s): DDIMER in the last 168 hours.   Radiology/Studies:  CT Angio Chest PE W and/or Wo Contrast  Result Date: 09/13/2021 CLINICAL DATA:  Lung/mediastinal abscess.  Recent neck abscess. EXAM: CT ANGIOGRAPHY CHEST WITH CONTRAST TECHNIQUE: Multidetector CT imaging of the chest was performed using the standard protocol during bolus administration of intravenous contrast. Multiplanar CT image reconstructions and MIPs were obtained to evaluate the vascular anatomy.  CONTRAST:  45mL OMNIPAQUE IOHEXOL 350 MG/ML SOLN COMPARISON:  CT of the neck 09/07/2021. FINDINGS: Cardiovascular: Satisfactory opacification of the pulmonary arteries to the segmental level. No evidence of pulmonary embolism. Normal heart size. No pericardial effusion. Mediastinum/Nodes: There is no evidence for abscess or acute inflammatory change. There is a 6 mm hypodense left thyroid nodule. There are no enlarged mediastinal or hilar lymph nodes identified. The esophagus is nondilated. Lungs/Pleura: There is atelectasis in the bilateral lower lobes. There is no focal lung infiltrate, pleural effusion or pneumothorax. Upper Abdomen: No acute abnormality. Musculoskeletal: No chest wall abnormality. No acute or significant osseous findings. Review of  the MIP images confirms the above findings. IMPRESSION: 1. No evidence for pulmonary embolism. 2. No acute cardiopulmonary process. No mediastinal abscess or inflammation. 3. Subcentimeter incidental left thyroid nodule. No follow-up imaging is recommended. Reference: J Am Coll Radiol. 2015 Feb;12(2): 143-50 Electronically Signed   By: Darliss Cheney M.D.   On: 09/13/2021 23:46   DG Chest Portable 1 View  Result Date: 09/13/2021 CLINICAL DATA:  Chest pain short of breath EXAM: PORTABLE CHEST 1 VIEW COMPARISON:  09/08/2021 FINDINGS: No focal opacity or pleural effusion. Cardiomediastinal silhouette within normal limits. Aortic atherosclerosis. IMPRESSION: No active disease. Electronically Signed   By: Jasmine Pang M.D.   On: 09/13/2021 20:13     Assessment and Plan:   80 year old female with hypertension and diabetes with recent submandibular abscess who presents with atypical chest pain following a meal after recent discharge found to have elevated troponin.  Troponin trended upwards after event but is mildly elevated at 211 and she does not have ongoing typical chest pain.  EKG is normal.  At this point favor supply demand mismatch in the setting of ongoing  leukocytosis and possible infection rather than plaque rupture mediated event.  If she has recurrent chest pain, echo abnormalities then could reconsider diagnosis but, given close proximity to eating and the atypical nature, less convincing for ACS.  Additionally, she has had 2 point drop in hemoglobin which remains unaccounted for.  #chest pain #elevated troponin, favor supply demand mismatch #submandibular abscess #leukocytosis #Acute anemia  Recommendations - S/p ASA 325 mg continue ASA 81 mg daily - Start high intensity statin - Echocardiogram; if abnormal or suggestive of ACS start heparin and can evaluate whether to pursue invasive strategy - inpatient v. Outpatient risk stratification; coronary CTA would be appropriate.   Luanna Salk, MD Cardiology Fellow [facility]     Risk Assessment/Risk Scores:     HEAR Score (for undifferentiated chest pain):  HEAR Score: 3    For questions or updates, please contact [facility] Please consult [email or web address] for contact info under    Signed, Regino Schultze, MD  09/14/2021 6:19 AM

## 2021-09-14 NOTE — ED Notes (Signed)
Pt had meal and is resting watching TV. No complaints.

## 2021-09-14 NOTE — H&P (Signed)
History and Physical    Shannon Stephenson IFO:[id number] DOB: 1941-07-02 DOA: 09/13/2021  PCP: Macy Mis, MD Patient coming from: Home  Chief Complaint: Chest pain  HPI: Shannon Stephenson is a 80 y.o. female with medical history significant of diet-controlled type 2 diabetes, hypertension, chronic back pain.  Recent dental extraction on 09/06/2021 complicated by large abscess of the sublingual space with extension into the left submandibular space.  Required intubation for airway protection and underwent I&D with Penrose drain placement on 09/08/2021.  Extubated on 09/10/2021.  Incidentally COVID-positive during this hospitalization.  Abscess culture grew Streptococcus mitis/ oralis resistant to clindamycin and intermittent resistance to penicillin/ceftriaxone.  She was discharged yesterday on a 2-week course of Augmentin per ID recommendations.  Returned to the ED later the same day complaining of chest pain.  Not febrile or tachycardic.  Labs notable for worsening leukocytosis, WBC 17.5.  Sodium 131.  High-sensitivity troponin elevated (53 > 142).  EKG without acute ischemic changes.  Chest x-ray showing no active disease.  CTA chest negative for PE or acute cardiopulmonary process.  Showing no mediastinal abscess or inflammation.  Showing subcentimeter incidental left thyroid nodule for which radiologist is not recommending any follow-up imaging. Patient was given aspirin 325 mg.  ED provider discussed the case with on-call cardiologist, will consult.  [place] interpreter services used.  Patient states she left the hospital yesterday and after going home had some rice soup.  Soon after she started experiencing right-sided burning chest pain.  Was also having some shortness of breath at that time.  The chest pain persisted for about 30 minutes which prompted her to return to the ED to be evaluated.  She denies history of any heart problems.  Denies history of prior episodes of chest pain or heartburn.   Denies fevers, nausea, vomiting, abdominal pain, diarrhea, or dysuria.  She has received 3 doses of the COVID-vaccine.  Review of Systems:  All systems reviewed and apart from history of presenting illness, are negative.  Past Medical History:  Diagnosis Date   Back pain    Diabetes mellitus without complication (HCC)    Hypertension     Past Surgical History:  Procedure Laterality Date   INCISION AND DRAINAGE ABSCESS N/A 09/08/2021   Procedure: INCISION AND DRAINAGE NECK ABSCESS;  Surgeon: Enis Slipper, DMD;  Location: [facility] OR;  Service: Oral Surgery;  Laterality: N/A;   LUMBAR LAMINECTOMY/DECOMPRESSION MICRODISCECTOMY Bilateral 10/09/2013   Procedure: MICRO LUMBAR DECOMPRESSION L3-L5 (2 LEVELS);  Surgeon: Javier Docker, MD;  Location: [facility] OR[facility];  Service: Orthopedics;  Laterality: Bilateral;   NO PAST SURGERIES       reports that she has never smoked. She has never used smokeless tobacco. She reports that she does not drink alcohol and does not use drugs.  No Known Allergies  Family History  Problem Relation Age of Onset   Breast cancer Neg Hx     Prior to Admission medications   Medication Sig Start Date End Date Taking? Authorizing Provider  acetaminophen (TYLENOL) 500 MG tablet Take 500 mg by mouth every 6 (six) hours as needed for pain.    [provider]  alendronate (FOSAMAX) 70 MG tablet Take 70 mg by mouth once a week. 08/22/21   [provider]  amoxicillin-clavulanate (AUGMENTIN) 875-125 MG tablet Take 1 tablet by mouth 2 (two) times daily for 13 days. 09/13/21 09/26/21  Almon Hercules, MD  chlorhexidine (PERIDEX) 0.12 % solution Use as directed 15 mLs in the  mouth or throat 4 (four) times daily. 09/13/21   Almon Hercules, MD  conjugated estrogens (PREMARIN) vaginal cream Place 1 Applicatorful vaginally daily. Patient not taking: No sig reported 01/24/17   Audry Pili, PA-C  hydrochlorothiazide (HYDRODIURIL) 25 MG tablet Take 25 mg by mouth every  morning.     [provider]  ibuprofen (ADVIL) 600 MG tablet Take 600 mg by mouth every 6 (six) hours as needed for pain. 09/06/21   [provider]  traMADol (ULTRAM) 50 MG tablet Take 1 tablet (50 mg total) by mouth every 6 (six) hours as needed for pain. 10/09/13   Jene Every, MD    Physical Exam: Vitals:   09/14/21 0015 09/14/21 0030 09/14/21 0045 09/14/21 0100  BP: (!) 109/53 111/63  (!) 97/51  Pulse: 71 72 77 71  Resp: 14 12 (!) 21 12  Temp:      SpO2: 96% 98% 96% 95%    Physical Exam Constitutional:      General: She is not in acute distress. HENT:     Head: Normocephalic and atraumatic.  Eyes:     Extraocular Movements: Extraocular movements intact.     Conjunctiva/sclera: Conjunctivae normal.  Cardiovascular:     Rate and Rhythm: Normal rate and regular rhythm.     Pulses: Normal pulses.  Pulmonary:     Effort: Pulmonary effort is normal. No respiratory distress.     Breath sounds: Normal breath sounds. No wheezing or rales.  Abdominal:     General: Bowel sounds are normal. There is no distension.     Palpations: Abdomen is soft.     Tenderness: There is no abdominal tenderness.  Musculoskeletal:        General: No swelling or tenderness.     Cervical back: Normal range of motion and neck supple.  Skin:    General: Skin is warm and dry.  Neurological:     General: No focal deficit present.     Mental Status: She is alert and oriented to person, place, and time.     Labs on Admission: I have personally reviewed following labs and imaging studies  CBC: Recent Labs  Lab 09/07/21 1950 09/08/21 0448 09/08/21 2346 09/09/21 0626 09/11/21 0206 09/12/21 0610 09/13/21 1940  WBC 17.7* 19.1*  --  13.5* 11.6* 12.2* 17.5*  NEUTROABS 16.5*  --   --  12.3*  --   --  14.2*  HGB 12.2 12.4 10.9* 11.9* 10.4* 12.3 12.7  HCT 38.3 39.0 32.0* 37.0 31.8* 38.2 41.1  MCV 95.3 96.3  --  94.4 91.9 93.6 98.3  PLT 210 215  --  239 244 295 304   Basic  Metabolic Panel: Recent Labs  Lab 09/08/21 0448 09/08/21 2346 09/09/21 0626 09/11/21 0206 09/12/21 0610 09/13/21 1940  NA 137 140 141 139 138 131*  K 4.6 3.6 3.8 3.7 4.2 4.2  CL 104  --  104 105 100 96*  CO2 25  --  26 27 29 26   GLUCOSE 195*  --  220* 73 122* 180*  BUN 17  --  29* 28* 22 24*  CREATININE 0.82  --  0.94 0.81 0.80 0.89  CALCIUM 8.7*  --  8.3* 7.9* 8.4* 8.4*   GFR: Estimated Creatinine Clearance: 44 mL/min (by C-G formula based on SCr of 0.89 mg/dL). Liver Function Tests: Recent Labs  Lab 09/08/21 0448 09/13/21 1940  AST 51* 32  ALT 58* 36  ALKPHOS 102 60  BILITOT 0.8 0.8  PROT 6.5  6.1*  ALBUMIN 2.6* 2.5*   No results for input(s): LIPASE, AMYLASE in the last 168 hours. No results for input(s): AMMONIA in the last 168 hours. Coagulation Profile: No results for input(s): INR, PROTIME in the last 168 hours. Cardiac Enzymes: No results for input(s): CKTOTAL, CKMB, CKMBINDEX, TROPONINI in the last 168 hours. BNP (last 3 results) No results for input(s): PROBNP in the last 8760 hours. HbA1C: No results for input(s): HGBA1C in the last 72 hours. CBG: Recent Labs  Lab 09/12/21 0720 09/12/21 1124 09/12/21 1533 09/12/21 2224 09/13/21 0730  GLUCAP 103* 157* 192* 227* 125*   Lipid Profile: No results for input(s): CHOL, HDL, LDLCALC, TRIG, CHOLHDL, LDLDIRECT in the last 72 hours. Thyroid Function Tests: No results for input(s): TSH, T4TOTAL, FREET4, T3FREE, THYROIDAB in the last 72 hours. Anemia Panel: No results for input(s): VITAMINB12, FOLATE, FERRITIN, TIBC, IRON, RETICCTPCT in the last 72 hours. Urine analysis:    Component Value Date/Time   COLORURINE YELLOW 04/06/2021 2220   APPEARANCEUR CLEAR 04/06/2021 2220   LABSPEC 1.013 04/06/2021 2220   PHURINE 6.0 04/06/2021 2220   GLUCOSEU NEGATIVE 04/06/2021 2220   HGBUR NEGATIVE 04/06/2021 2220   BILIRUBINUR NEGATIVE 04/06/2021 2220   KETONESUR NEGATIVE 04/06/2021 2220   PROTEINUR NEGATIVE  04/06/2021 2220   UROBILINOGEN 0.2 08/13/2013 2022   NITRITE NEGATIVE 04/06/2021 2220   LEUKOCYTESUR NEGATIVE 04/06/2021 2220    Radiological Exams on Admission: CT Angio Chest PE W and/or Wo Contrast  Result Date: 09/13/2021 CLINICAL DATA:  Lung/mediastinal abscess.  Recent neck abscess. EXAM: CT ANGIOGRAPHY CHEST WITH CONTRAST TECHNIQUE: Multidetector CT imaging of the chest was performed using the standard protocol during bolus administration of intravenous contrast. Multiplanar CT image reconstructions and MIPs were obtained to evaluate the vascular anatomy. CONTRAST:  48mL OMNIPAQUE IOHEXOL 350 MG/ML SOLN COMPARISON:  CT of the neck 09/07/2021. FINDINGS: Cardiovascular: Satisfactory opacification of the pulmonary arteries to the segmental level. No evidence of pulmonary embolism. Normal heart size. No pericardial effusion. Mediastinum/Nodes: There is no evidence for abscess or acute inflammatory change. There is a 6 mm hypodense left thyroid nodule. There are no enlarged mediastinal or hilar lymph nodes identified. The esophagus is nondilated. Lungs/Pleura: There is atelectasis in the bilateral lower lobes. There is no focal lung infiltrate, pleural effusion or pneumothorax. Upper Abdomen: No acute abnormality. Musculoskeletal: No chest wall abnormality. No acute or significant osseous findings. Review of the MIP images confirms the above findings. IMPRESSION: 1. No evidence for pulmonary embolism. 2. No acute cardiopulmonary process. No mediastinal abscess or inflammation. 3. Subcentimeter incidental left thyroid nodule. No follow-up imaging is recommended. Reference: J Am Coll Radiol. 2015 Feb;12(2): 143-50 Electronically Signed   By: Darliss Cheney M.D.   On: 09/13/2021 23:46   DG Chest Portable 1 View  Result Date: 09/13/2021 CLINICAL DATA:  Chest pain short of breath EXAM: PORTABLE CHEST 1 VIEW COMPARISON:  09/08/2021 FINDINGS: No focal opacity or pleural effusion. Cardiomediastinal silhouette  within normal limits. Aortic atherosclerosis. IMPRESSION: No active disease. Electronically Signed   By: Jasmine Pang M.D.   On: 09/13/2021 20:13    EKG: Independently reviewed.  Sinus rhythm, LVH.  No acute ischemic changes.  Assessment/Plan Principal Problem:   Chest pain Active Problems:   T2DM (type 2 diabetes mellitus) (HCC)   Mouth abscess   Leukocytosis   Lab test positive for detection of COVID-19 virus   Chest pain Elevated troponin CT angiogram negative for PE. High-sensitivity troponin elevated (53 > 142).  EKG without acute  ischemic changes. ?NSTEMI but chest pain was only transient and sounds atypical (right-sided, burning).  Currently chest pain-free and appears comfortable.  Denies history of GERD.  No prior stress test/cardiac catheterization done and does have risk factors for CAD. -Cardiology consulted -Cardiac monitoring -Third set of troponin pending -Full dose aspirin given in the ED -Echocardiogram ordered  Leukocytosis Labs showing worsening leukocytosis, WBC 17.5.  No fever, tachycardia, or signs of sepsis.  CT negative for pneumonia or mediastinal abscess. 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  No respiratory distress.  Able to open and close mouth.  CT negative for extension of infection into the mediastinum. -Continue Augmentin  Incidental COVID-positive on 09/07/2021 Not hypoxic or symptomatic.  CT without evidence of pneumonia.  She is vaccinated and boosted. -Continue airborne and contact precautions for 10 days from the date of positive test  Mild hyponatremia Likely due to poor oral intake. -Gentle IV fluid hydration -Monitor BMP  Diet controlled type 2 diabetes A1c 6.8 on 09/09/2021.  DVT prophylaxis: Subcutaneous heparin Code Status: Patient wishes to be full code. Family Communication: No family available at this time. Disposition Plan: Status is:  Observation  The patient remains OBS appropriate and will d/c before 2 midnights.  Dispo: The patient is from: Home              Anticipated d/c is to: Home              Patient currently is not medically stable to d/c.   Difficult to place patient No  Level of care: Level of care: Progressive  The medical decision making on this patient was of high complexity and the patient is at high risk for clinical deterioration, therefore this is a level 3 visit.  John Giovanni MD [facility]  If 7PM-7AM, please contact night-coverage [email or web address]  09/14/2021, 3:44 AM

## 2021-09-14 NOTE — Care Plan (Signed)
This 80 years old female with PMH significant for type 2 diabetes, hypertension, chronic back pain presented in the ED with right-sided chest pain.  Patient had recent dental extraction on 9/26, complicated by a sublingual and submandibular abscess requiring intubation and incision and drainage on 9/28.  Hospital course complicated by COVID-19 and then patient was discharged on 10/3 on Augmentin.  Patient has developed some right-sided chest pain following meal associated with some shortness of breath.  EKG without acute ischemic changes.  CTA chest negative for PE or acute cardiopulmonary process.  Chest pain has now resolved.  She is found to have elevated troponins.  Patient has received 3 doses of COVID-vaccine.  Cardiology consulted, recommended to continue aspirin statins, obtain echocardiogram.  Patient was seen and examined at bedside she reports chest pain has resolved.

## 2021-09-14 NOTE — Progress Notes (Signed)
Patient admitted to 4 E 16. Resting comfortably, on heart monitor, CCMD notified, call bell and phone within reach. Netta Corrigan, RN

## 2021-09-15 DIAGNOSIS — R079 Chest pain, unspecified: Secondary | ICD-10-CM | POA: Diagnosis not present

## 2021-09-15 LAB — CBC
HCT: 34.4 % — ABNORMAL LOW (ref 36.0–46.0)
Hemoglobin: 11.2 g/dL — ABNORMAL LOW (ref 12.0–15.0)
MCH: 30.3 pg (ref 26.0–34.0)
MCHC: 32.6 g/dL (ref 30.0–36.0)
MCV: 93 fL (ref 80.0–100.0)
Platelets: 347 10*3/uL (ref 150–400)
RBC: 3.7 MIL/uL — ABNORMAL LOW (ref 3.87–5.11)
RDW: 12.2 % (ref 11.5–15.5)
WBC: 11.6 10*3/uL — ABNORMAL HIGH (ref 4.0–10.5)
nRBC: 0 % (ref 0.0–0.2)

## 2021-09-15 LAB — BASIC METABOLIC PANEL
Anion gap: 7 (ref 5–15)
BUN: 23 mg/dL (ref 8–23)
CO2: 27 mmol/L (ref 22–32)
Calcium: 8.4 mg/dL — ABNORMAL LOW (ref 8.9–10.3)
Chloride: 101 mmol/L (ref 98–111)
Creatinine, Ser: 0.93 mg/dL (ref 0.44–1.00)
GFR, Estimated: >60 mL/min (ref >60)
Glucose, Bld: 133 mg/dL — ABNORMAL HIGH (ref 70–99)
Potassium: 4.1 mmol/L (ref 3.5–5.1)
Sodium: 135 mmol/L (ref 135–145)

## 2021-09-15 LAB — PHOSPHORUS: Phosphorus: 3.2 mg/dL (ref 2.5–4.6)

## 2021-09-15 LAB — MAGNESIUM: Magnesium: 2.1 mg/dL (ref 1.7–2.4)

## 2021-09-15 NOTE — Discharge Instructions (Signed)
Advised to follow with PCP in 1 week. Advised to continue Augmentin and complete the course for submandibular abscess.

## 2021-09-15 NOTE — Plan of Care (Signed)
Plan of care initiated and progressing 

## 2021-09-15 NOTE — Discharge Summary (Signed)
Physician Discharge Summary  Shannon Stephenson KZS:[id number] DOB: [birth year] DOA: 09/13/2021  PCP: Macy Mis, MD  Admit date: 09/13/2021  Discharge date: 09/15/2021  Admitted From: Home.  Disposition:  Home.  Recommendations for Outpatient Follow-up:  Follow up with PCP in 1-2 weeks. Please obtain BMP/CBC in one week. Advised to continue Augmentin and complete the course for submandibular abscess.  Home Health: None Equipment/Devices:None  Discharge Condition: Stable CODE STATUS:Full code Diet recommendation: Heart Healthy  Brief [facility] Course: This 80 years old female with PMH significant for type 2 diabetes, hypertension, chronic back pain presented in the ED with right-sided chest pain. Patient had recent dental extraction on 9/26, complicated by  sublingual and submandibular abscess requiring intubation and incision and drainage on 9/28.  Hospital course complicated by COVID-19 and then patient was discharged on 10/3 on Augmentin.  Patient has developed some right-sided chest pain following meal associated with some shortness of breath.  EKG without acute ischemic changes.  CTA chest negative for PE or acute cardiopulmonary process. Chest pain has now resolved.  She is found to have elevated troponins.  Patient has received 3 doses of COVID-vaccine.  Cardiology consulted, recommended to continue aspirin, statin. Echocardiogram showed: LVEF 75% , grade 1 diastolic dysfunction.  Her symptoms could be in the setting of myocarditis secondary to COVID.  She does not have any symptoms of heart failure. Patient is cleared from cardiology to be discharged.  Patient feels better and want to be discharged. Patient is being discharged home,  advised to complete Augmentin for submental abscess.    Discharge Diagnoses:  Principal Problem:   Chest pain Active Problems:   T2DM (type 2 diabetes mellitus) (HCC)   Mouth abscess   Leukocytosis   Lab test positive for detection of  COVID-19 virus    Discharge Instructions  Discharge Instructions     Call MD for:  difficulty breathing, headache or visual disturbances   Complete by: As directed    Call MD for:  persistant dizziness or light-headedness   Complete by: As directed    Call MD for:  persistant nausea and vomiting   Complete by: As directed    Diet - low sodium heart healthy   Complete by: As directed    Diet - low sodium heart healthy   Complete by: As directed    Diet Carb Modified   Complete by: As directed    Discharge instructions   Complete by: As directed    Advised to follow with PCP in 1 week. Advised to continue Augmentin and complete the course for submandibular abscess.   Increase activity slowly   Complete by: As directed    Increase activity slowly   Complete by: As directed       Allergies as of 09/15/2021   No Known Allergies      Medication List     STOP taking these medications    conjugated estrogens 0.625 MG/GM vaginal cream Commonly known as: PREMARIN       TAKE these medications    acetaminophen 500 MG tablet Commonly known as: TYLENOL Take 500 mg by mouth every 6 (six) hours as needed for pain.   alendronate 70 MG tablet Commonly known as: FOSAMAX Take 70 mg by mouth once a week.   amoxicillin-clavulanate 875-125 MG tablet Commonly known as: Augmentin Take 1 tablet by mouth 2 (two) times daily for 13 days. What changed:  when to take this additional instructions   chlorhexidine 0.12 % solution  Commonly known as: PERIDEX Use as directed 15 mLs in the mouth or throat 4 (four) times daily.   hydrochlorothiazide 25 MG tablet Commonly known as: HYDRODIURIL Take 25 mg by mouth every morning.   ibuprofen 600 MG tablet Commonly known as: ADVIL Take 600 mg by mouth every 6 (six) hours as needed for pain.   traMADol 50 MG tablet Commonly known as: Ultram Take 1 tablet (50 mg total) by mouth every 6 (six) hours as needed for pain.         Follow-up Information     Macy Mis, MD Follow up in 1 week(s).   Specialty: Family Medicine Contact information: [address] [place] [address] [phone]                No Known Allergies  Consultations: Cardiology   Procedures/Studies: DG Orthopantogram  Result Date: 09/08/2021 CLINICAL DATA:  Swelling EXAM: ORTHOPANTOGRAM/PANORAMIC 1 view COMPARISON:  None. FINDINGS: Dental amalgam. No periapical lucencies identified. The mandible demonstrates normal alignment without acute osseous abnormality. IMPRESSION: No acute abnormality. No radiographic findings of a periodontal abscess. Electronically Signed   By: Olive Bass M.D.   On: 09/08/2021 10:55   CT Soft Tissue Neck W Contrast  Result Date: 09/07/2021 CLINICAL DATA:  Throat swelling and dental disease. EXAM: CT NECK WITH CONTRAST TECHNIQUE: Multidetector CT imaging of the neck was performed using the standard protocol following the bolus administration of intravenous contrast. CONTRAST:  75mL OMNIPAQUE IOHEXOL 350 MG/ML SOLN COMPARISON:  None. FINDINGS: Pharynx and larynx: There is a large abscess of sublingual space that measures 4.4 x 1.3 cm. There is multifocal internal gas. There is a component of the abscess extending into the left submandibular space. The retropharyngeal space is clear. The nasopharynx is normal. There is no airway stenosis. Normal epiglottis. Salivary glands: Negative Thyroid: Normal Lymph nodes: None enlarged or abnormal density. Vascular: Negative. Limited intracranial: Negative. Visualized orbits: Negative. Mastoids and visualized paranasal sinuses: Clear. Skeleton: No acute or aggressive process. Upper chest: Negative. Other: None. IMPRESSION: 1. Large abscess of the sublingual space with extension into the left submandibular space. 2. No airway stenosis. Electronically Signed   By: Deatra Robinson M.D.   On: 09/07/2021 22:36   CT Angio Chest PE W and/or Wo  Contrast  Result Date: 09/13/2021 CLINICAL DATA:  Lung/mediastinal abscess.  Recent neck abscess. EXAM: CT ANGIOGRAPHY CHEST WITH CONTRAST TECHNIQUE: Multidetector CT imaging of the chest was performed using the standard protocol during bolus administration of intravenous contrast. Multiplanar CT image reconstructions and MIPs were obtained to evaluate the vascular anatomy. CONTRAST:  80mL OMNIPAQUE IOHEXOL 350 MG/ML SOLN COMPARISON:  CT of the neck 09/07/2021. FINDINGS: Cardiovascular: Satisfactory opacification of the pulmonary arteries to the segmental level. No evidence of pulmonary embolism. Normal heart size. No pericardial effusion. Mediastinum/Nodes: There is no evidence for abscess or acute inflammatory change. There is a 6 mm hypodense left thyroid nodule. There are no enlarged mediastinal or hilar lymph nodes identified. The esophagus is nondilated. Lungs/Pleura: There is atelectasis in the bilateral lower lobes. There is no focal lung infiltrate, pleural effusion or pneumothorax. Upper Abdomen: No acute abnormality. Musculoskeletal: No chest wall abnormality. No acute or significant osseous findings. Review of the MIP images confirms the above findings. IMPRESSION: 1. No evidence for pulmonary embolism. 2. No acute cardiopulmonary process. No mediastinal abscess or inflammation. 3. Subcentimeter incidental left thyroid nodule. No follow-up imaging is recommended. Reference: J Am Coll Radiol. 2015 Feb;12(2): 143-50 Electronically Signed  By: Darliss Cheney M.D.   On: 09/13/2021 23:46   DG Chest Portable 1 View  Result Date: 09/13/2021 CLINICAL DATA:  Chest pain short of breath EXAM: PORTABLE CHEST 1 VIEW COMPARISON:  09/08/2021 FINDINGS: No focal opacity or pleural effusion. Cardiomediastinal silhouette within normal limits. Aortic atherosclerosis. IMPRESSION: No active disease. Electronically Signed   By: Jasmine Pang M.D.   On: 09/13/2021 20:13   DG CHEST PORT 1 VIEW  Result Date:  09/08/2021 CLINICAL DATA:  Intubated EXAM: PORTABLE CHEST 1 VIEW COMPARISON:  04/06/2021 FINDINGS: Single frontal view of the chest demonstrates an unremarkable cardiac silhouette. Endotracheal tube overlies tracheal air column tip at level of thoracic inlet. Linear consolidation at the left lung base likely reflects atelectasis. No airspace disease, effusion, or pneumothorax. IMPRESSION: 1. No complications after intubation. 2. Likely hypoventilatory changes left lung base. Electronically Signed   By: Sharlet Salina M.D.   On: 09/08/2021 21:39   ECHOCARDIOGRAM COMPLETE  Result Date: 09/14/2021    ECHOCARDIOGRAM REPORT   Patient Name:   Shannon Stephenson Date of Exam: 09/14/2021 Medical Rec #:  [id number]       Height:       61.0 in Accession #:    [id number]      Weight:       141.8 lb Date of Birth:  [birth year]        BSA:          1.632 m Patient Age:    80 years        BP:           123/60 mmHg Patient Gender: F               HR:           69 bpm. Exam Location:  Inpatient Procedure: 2D Echo, Color Doppler and Cardiac Doppler Indications:    R07.9* Chest pain, unspecified  History:        Patient has no prior history of Echocardiogram examinations.                 Previous Myocardial Infarction, Signs/Symptoms:Chest Pain; Risk                 Factors:Hypertension. Covid positive.  Sonographer:    Sheralyn Boatman RDCS Referring Phys: [id number] [facility]  Sonographer Comments: Technically difficult study due to poor echo windows. IMPRESSIONS  1. Left ventricular ejection fraction, by estimation, is >75%. The left ventricle has hyperdynamic function. The left ventricle has no regional wall motion abnormalities. There is mild left ventricular hypertrophy of the basal-septal segment. Left ventricular diastolic parameters are consistent with Grade I diastolic dysfunction (impaired relaxation). Elevated left atrial pressure.  2. Right ventricular systolic function is normal. The right ventricular size is normal.  3. The  mitral valve is normal in structure. Trivial mitral valve regurgitation. No evidence of mitral stenosis.  4. The aortic valve has an indeterminant number of cusps. Aortic valve regurgitation is mild. No aortic stenosis is present.  5. The inferior vena cava is normal in size with greater than 50% respiratory variability, suggesting right atrial pressure of 3 mmHg. FINDINGS  Left Ventricle: Left ventricular ejection fraction, by estimation, is >75%. The left ventricle has hyperdynamic function. The left ventricle has no regional wall motion abnormalities. The left ventricular internal cavity size was normal in size. There is mild left ventricular hypertrophy of the basal-septal segment. Left ventricular diastolic parameters are consistent with Grade I diastolic dysfunction (impaired relaxation). Elevated  left atrial pressure. Right Ventricle: The right ventricular size is normal. Right ventricular systolic function is normal. Left Atrium: Left atrial size was normal in size. Right Atrium: Right atrial size was normal in size. Pericardium: There is no evidence of pericardial effusion. Mitral Valve: The mitral valve is normal in structure. Mild mitral annular calcification. Trivial mitral valve regurgitation. No evidence of mitral valve stenosis. Tricuspid Valve: The tricuspid valve is normal in structure. Tricuspid valve regurgitation is trivial. No evidence of tricuspid stenosis. Aortic Valve: The aortic valve has an indeterminant number of cusps. Aortic valve regurgitation is mild. No aortic stenosis is present. Pulmonic Valve: The pulmonic valve was not well visualized. Pulmonic valve regurgitation is not visualized. No evidence of pulmonic stenosis. Aorta: The aortic root is normal in size and structure. Venous: The inferior vena cava is normal in size with greater than 50% respiratory variability, suggesting right atrial pressure of 3 mmHg. IAS/Shunts: No atrial level shunt detected by color flow Doppler.  LEFT  VENTRICLE PLAX 2D LVIDd:         3.50 cm     Diastology LVIDs:         2.00 cm     LV e' medial:    3.70 cm/s LV PW:         1.20 cm     LV E/e' medial:  16.9 LV IVS:        1.60 cm     LV e' lateral:   5.44 cm/s LVOT diam:     1.80 cm     LV E/e' lateral: 11.5 LV SV:         61 LV SV Index:   37 LVOT Area:     2.54 cm  LV Volumes (MOD) LV vol d, MOD A2C: 34.1 ml LV vol d, MOD A4C: 53.4 ml LV vol s, MOD A2C: 7.9 ml LV vol s, MOD A4C: 11.1 ml LV SV MOD A2C:     26.2 ml LV SV MOD A4C:     53.4 ml LV SV MOD BP:      33.6 ml RIGHT VENTRICLE             IVC RV S prime:     10.90 cm/s  IVC diam: 1.60 cm TAPSE (M-mode): 1.6 cm LEFT ATRIUM           Index       RIGHT ATRIUM          Index LA diam:      3.40 cm 2.08 cm/m  RA Area:     6.09 cm LA Vol (A2C): 17.4 ml 10.66 ml/m RA Volume:   7.92 ml  4.85 ml/m LA Vol (A4C): 21.5 ml 13.18 ml/m  AORTIC VALVE LVOT Vmax:   132.00 cm/s LVOT Vmean:  86.200 cm/s LVOT VTI:    0.239 m  AORTA Ao Root diam: 2.90 cm Ao Asc diam:  3.80 cm MITRAL VALVE MV Area (PHT): 2.69 cm     SHUNTS MV Decel Time: 282 msec     Systemic VTI:  0.24 m MV E velocity: 62.40 cm/s   Systemic Diam: 1.80 cm MV A velocity: 104.00 cm/s MV E/A ratio:  0.60 Olga Millers MD Electronically signed by Olga Millers MD Signature Date/Time: 09/14/2021/2:24:44 PM    Final       Subjective: Patient was seen and examined at bedside.  Overnight events noted.   Patient reports feeling much improved.  Patient wants to be discharged.  Denies any chest  pain.  Discharge Exam: Vitals:   09/15/21 0423 09/15/21 0818  BP: (!) 113/58 (!) 121/48  Pulse: 72 74  Resp: 19 16  Temp: 97.9 F (36.6 C) 99 F (37.2 C)  SpO2: 94% 95%   Vitals:   09/14/21 2027 09/14/21 2352 09/15/21 0423 09/15/21 0818  BP: 115/63 (!) 102/31 (!) 113/58 (!) 121/48  Pulse: 75 75 72 74  Resp: 20 20 19 16   Temp: 97.8 F (36.6 C) 97.8 F (36.6 C) 97.9 F (36.6 C) 99 F (37.2 C)  TempSrc: Oral Oral Oral Oral  SpO2: 95% 96% 94% 95%     General: Pt is alert, awake, not in acute distress Cardiovascular: RRR, S1/S2 +, no rubs, no gallops Respiratory: CTA bilaterally, no wheezing, no rhonchi Abdominal: Soft, NT, ND, bowel sounds + Extremities: no edema, no cyanosis    The results of significant diagnostics from this hospitalization (including imaging, microbiology, ancillary and laboratory) are listed below for reference.     Microbiology: Recent Results (from the past 240 hour(s))  Culture, blood (routine x 2)     Status: None   Collection Time: 09/07/21 10:10 PM   Specimen: BLOOD LEFT ARM  Result Value Ref Range Status   Specimen Description BLOOD LEFT ARM  Final   Special Requests   Final    BOTTLES DRAWN AEROBIC AND ANAEROBIC Blood Culture adequate volume   Culture   Final    NO GROWTH 5 DAYS Performed at [facility], [address], [place], [place]    Report Status 09/12/2021 FINAL  Final  Culture, blood (routine x 2)     Status: None   Collection Time: 09/07/21 10:30 PM   Specimen: BLOOD RIGHT ARM  Result Value Ref Range Status   Specimen Description BLOOD RIGHT ARM  Final   Special Requests   Final    BOTTLES DRAWN AEROBIC AND ANAEROBIC Blood Culture adequate volume   Culture   Final    NO GROWTH 5 DAYS Performed at [facility], [address], [place], [place]    Report Status 09/12/2021 FINAL  Final  Resp Panel by RT-PCR (Flu A&B, Covid) Nasopharyngeal Swab     Status: Abnormal   Collection Time: 09/07/21 10:46 PM   Specimen: Nasopharyngeal Swab; Nasopharyngeal(NP) swabs in vial transport medium  Result Value Ref Range Status   SARS Coronavirus 2 by RT PCR POSITIVE (A) NEGATIVE Final    Comment: RESULT CALLED TO, READ BACK BY AND VERIFIED WITH: RN ROBERT S. 09/08/21@00 :06 BY TW (NOTE) SARS-CoV-2 target nucleic acids are DETECTED.  The SARS-CoV-2 RNA is generally detectable in upper respiratory specimens during the acute phase of infection. Positive results  are indicative of the presence of the identified virus, but do not rule out bacterial infection or co-infection with other pathogens not detected by the test. Clinical correlation with patient history and other diagnostic information is necessary to determine patient infection status. The expected result is Negative.  Fact Sheet for Patients: 09/10/21  Fact Sheet for Healthcare Providers: BloggerCourse.com  This test is not yet approved or cleared by the [place] FDA and  has been authorized for detection and/or diagnosis of SARS-CoV-2 by FDA under an Emergency Use Authorization (EUA).  This EUA will remain in effect (meaning this test can be  used) for the duration of  the COVID-19 declaration under Section 564(b)(1) of the Act, 21 U.S.C. section 360bbb-3(b)(1), unless the authorization is terminated or revoked sooner.  Influenza A by PCR NEGATIVE NEGATIVE Final   Influenza B by PCR NEGATIVE NEGATIVE Final    Comment: (NOTE) The Xpert Xpress SARS-CoV-2/FLU/RSV plus assay is intended as an aid in the diagnosis of influenza from Nasopharyngeal swab specimens and should not be used as a sole basis for treatment. Nasal washings and aspirates are unacceptable for Xpert Xpress SARS-CoV-2/FLU/RSV testing.  Fact Sheet for Patients: BloggerCourse.com  Fact Sheet for Healthcare Providers: SeriousBroker.it  This test is not yet approved or cleared by the [place] FDA and has been authorized for detection and/or diagnosis of SARS-CoV-2 by FDA under an Emergency Use Authorization (EUA). This EUA will remain in effect (meaning this test can be used) for the duration of the COVID-19 declaration under Section 564(b)(1) of the Act, 21 U.S.C. section 360bbb-3(b)(1), unless the authorization is terminated or revoked.  Performed at [facility], [address],  [place], [place] [address]   Fungus Culture With Stain     Status: None (Preliminary result)   Collection Time: 09/08/21  7:28 PM   Specimen: Neck, Left; Abscess  Result Value Ref Range Status   Fungus Stain Final report  Final    Comment: (NOTE) Performed At: [facility] [address] [place], [place] [id number] Jolene Schimke MD XB:[id number]    Fungus (Mycology) Culture PENDING  Incomplete   Fungal Source LEFT NECK SPEC A  Final    Comment: Performed at [facility], [address], [place], [place] [address]  Aerobic/Anaerobic Culture w Gram Stain (surgical/deep wound)     Status: None   Collection Time: 09/08/21  7:28 PM   Specimen: Neck, Left; Abscess  Result Value Ref Range Status   Specimen Description ABSCESS  Final   Special Requests LEFT NECK SPEC A  Final   Gram Stain   Final    FEW SQUAMOUS EPITHELIAL CELLS PRESENT FEW WBC PRESENT, PREDOMINANTLY MONONUCLEAR MODERATE GRAM POSITIVE COCCI FEW GRAM NEGATIVE RODS FEW GRAM POSITIVE RODS    Culture   Final    RARE STREPTOCOCCUS CONSTELLATUS RARE STREPTOCOCCUS MITIS/ORALIS RARE CARDIOBACTERIUM HOMINIS Standardized susceptibility testing for this organism is not available. RARE PREVOTELLA DENTICOLA BETA LACTAMASE POSITIVE Performed at [facility], [address] [place], [place] [address]    Report Status 09/13/2021 FINAL  Final   Organism ID, Bacteria STREPTOCOCCUS MITIS/ORALIS  Final   Organism ID, Bacteria STREPTOCOCCUS CONSTELLATUS  Final      Susceptibility   Streptococcus constellatus - MIC*    PENICILLIN <=0.06 SENSITIVE Sensitive     CEFTRIAXONE 0.5 SENSITIVE Sensitive     ERYTHROMYCIN 4 RESISTANT Resistant     LEVOFLOXACIN 0.5 SENSITIVE Sensitive     VANCOMYCIN 0.5 SENSITIVE Sensitive     * RARE STREPTOCOCCUS CONSTELLATUS   Streptococcus mitis/oralis - MIC*    TETRACYCLINE 4 SENSITIVE Sensitive     VANCOMYCIN 0.5 SENSITIVE Sensitive     CLINDAMYCIN >=1 RESISTANT Resistant      PENICILLIN Value in next row Intermediate      INTERMEDIATE1    CEFTRIAXONE Value in next row Intermediate      INTERMEDIATE2    * RARE STREPTOCOCCUS MITIS/ORALIS  Fungus Culture Result     Status: None   Collection Time: 09/08/21  7:28 PM  Result Value Ref Range Status   Result 1 Comment  Final    Comment: (NOTE) KOH/Calcofluor preparation:  no fungus observed. Performed At: [facility] [address] [place], [place] [id number] Jolene Schimke MD KG:[id number]  MRSA Next Gen by PCR, Nasal     Status: None   Collection Time: 09/08/21  9:09 PM   Specimen: Nasal Mucosa; Nasal Swab  Result Value Ref Range Status   MRSA by PCR Next Gen NOT DETECTED NOT DETECTED Final    Comment: (NOTE) The GeneXpert MRSA Assay (FDA approved for NASAL specimens only), is one component of a comprehensive MRSA colonization surveillance program. It is not intended to diagnose MRSA infection nor to guide or monitor treatment for MRSA infections. Test performance is not FDA approved in patients less than 51 years old. Performed at [facility], [address], [place], [place] [address]      Labs: BNP (last 3 results) No results for input(s): BNP in the last 8760 hours. Basic Metabolic Panel: Recent Labs  Lab 09/11/21 0206 09/12/21 0610 09/13/21 1940 09/14/21 0430 09/15/21 0231  NA 139 138 131* 134* 135  K 3.7 4.2 4.2 3.2* 4.1  CL 105 100 96* 103 101  CO2 GLUCOSE 73 122* 180* 115* 133*  BUN 28* 22 24* 19 23  CREATININE 0.81 0.80 0.89 0.70 0.93  CALCIUM 7.9* 8.4* 8.4* 7.0* 8.4*  MG  --   --   --   --  2.1  PHOS  --   --   --   --  3.2   Liver Function Tests: Recent Labs  Lab 09/13/21 1940  AST 32  ALT 36  ALKPHOS 60  BILITOT 0.8  PROT 6.1*  ALBUMIN 2.5*   No results for input(s): LIPASE, AMYLASE in the last 168 hours. No results for input(s): AMMONIA in the last 168 hours. CBC: Recent Labs  Lab 09/09/21 0626 09/11/21 0206 09/12/21 0610  09/13/21 1940 09/14/21 0430 09/15/21 0231  WBC 13.5* 11.6* 12.2* 17.5* 13.3* 11.6*  NEUTROABS 12.3*  --   --  14.2*  --   --   HGB 11.9* 10.4* 12.3 12.7 10.8* 11.2*  HCT 37.0 31.8* 38.2 41.1 33.1* 34.4*  MCV 94.4 91.9 93.6 98.3 93.0 93.0  PLT 239 244 295 304 305 347   Cardiac Enzymes: No results for input(s): CKTOTAL, CKMB, CKMBINDEX, TROPONINI in the last 168 hours. BNP: Invalid input(s): POCBNP CBG: Recent Labs  Lab 09/12/21 0720 09/12/21 1124 09/12/21 1533 09/12/21 2224 09/13/21 0730  GLUCAP 103* 157* 192* 227* 125*   D-Dimer No results for input(s): DDIMER in the last 72 hours. Hgb A1c No results for input(s): HGBA1C in the last 72 hours. Lipid Profile Recent Labs    09/14/21 1002 09/14/21 1329  CHOL 190 190  HDL 32* 31*  LDLCALC 125* 604*  TRIG 163* 144  CHOLHDL 5.9 6.1   Thyroid function studies No results for input(s): TSH, T4TOTAL, T3FREE, THYROIDAB in the last 72 hours.  Invalid input(s): FREET3 Anemia work up No results for input(s): VITAMINB12, FOLATE, FERRITIN, TIBC, IRON, RETICCTPCT in the last 72 hours. Urinalysis    Component Value Date/Time   COLORURINE YELLOW 09/14/2021 0352   APPEARANCEUR CLEAR 09/14/2021 0352   LABSPEC 1.020 09/14/2021 0352   PHURINE 8.0 09/14/2021 0352   GLUCOSEU NEGATIVE 09/14/2021 0352   HGBUR NEGATIVE 09/14/2021 0352   BILIRUBINUR NEGATIVE 09/14/2021 0352   KETONESUR NEGATIVE 09/14/2021 0352   PROTEINUR NEGATIVE 09/14/2021 0352   UROBILINOGEN 0.2 08/13/2013 2022   NITRITE NEGATIVE 09/14/2021 0352   LEUKOCYTESUR NEGATIVE 09/14/2021 0352   Sepsis Labs Invalid input(s): PROCALCITONIN,  WBC,  LACTICIDVEN Microbiology Recent Results (from the past 240 hour(s))  Culture, blood (routine x 2)     Status: None   Collection Time: 09/07/21 10:10 PM   Specimen: BLOOD LEFT ARM  Result Value Ref Range Status   Specimen Description BLOOD LEFT ARM  Final   Special Requests   Final    BOTTLES DRAWN AEROBIC AND ANAEROBIC  Blood Culture adequate volume   Culture   Final    NO GROWTH 5 DAYS Performed at [facility], [address] [place], [place] [address]    Report Status 09/12/2021 FINAL  Final  Culture, blood (routine x 2)     Status: None   Collection Time: 09/07/21 10:30 PM   Specimen: BLOOD RIGHT ARM  Result Value Ref Range Status   Specimen Description BLOOD RIGHT ARM  Final   Special Requests   Final    BOTTLES DRAWN AEROBIC AND ANAEROBIC Blood Culture adequate volume   Culture   Final    NO GROWTH 5 DAYS Performed at [facility], [address] [place], [place] [address]    Report Status 09/12/2021 FINAL  Final  Resp Panel by RT-PCR (Flu A&B, Covid) Nasopharyngeal Swab     Status: Abnormal   Collection Time: 09/07/21 10:46 PM   Specimen: Nasopharyngeal Swab; Nasopharyngeal(NP) swabs in vial transport medium  Result Value Ref Range Status   SARS Coronavirus 2 by RT PCR POSITIVE (A) NEGATIVE Final    Comment: RESULT CALLED TO, READ BACK BY AND VERIFIED WITH: RN ROBERT S. 09/08/21@00 :06 BY TW (NOTE) SARS-CoV-2 target nucleic acids are DETECTED.  The SARS-CoV-2 RNA is generally detectable in upper respiratory specimens during the acute phase of infection. Positive results are indicative of the presence of the identified virus, but do not rule out bacterial infection or co-infection with other pathogens not detected by the test. Clinical correlation with patient history and other diagnostic information is necessary to determine patient infection status. The expected result is Negative.  Fact Sheet for Patients: BloggerCourse.com  Fact Sheet for Healthcare Providers: SeriousBroker.it  This test is not yet approved or cleared by the [place] FDA and  has been authorized for detection and/or diagnosis of SARS-CoV-2 by FDA under an Emergency Use Authorization (EUA).  This EUA will remain in effect (meaning this test can be   used) for the duration of  the COVID-19 declaration under Section 564(b)(1) of the Act, 21 U.S.C. section 360bbb-3(b)(1), unless the authorization is terminated or revoked sooner.     Influenza A by PCR NEGATIVE NEGATIVE Final   Influenza B by PCR NEGATIVE NEGATIVE Final    Comment: (NOTE) The Xpert Xpress SARS-CoV-2/FLU/RSV plus assay is intended as an aid in the diagnosis of influenza from Nasopharyngeal swab specimens and should not be used as a sole basis for treatment. Nasal washings and aspirates are unacceptable for Xpert Xpress SARS-CoV-2/FLU/RSV testing.  Fact Sheet for Patients: BloggerCourse.com  Fact Sheet for Healthcare Providers: SeriousBroker.it  This test is not yet approved or cleared by the [place] FDA and has been authorized for detection and/or diagnosis of SARS-CoV-2 by FDA under an Emergency Use Authorization (EUA). This EUA will remain in effect (meaning this test can be used) for the duration of the COVID-19 declaration under Section 564(b)(1) of the Act, 21 U.S.C. section 360bbb-3(b)(1), unless the authorization is terminated or revoked.  Performed at [facility], [address] [place], [place] [address]   Fungus Culture With Stain     Status: None (Preliminary result)   Collection Time: 09/08/21  7:28 PM   Specimen: Neck, Left; Abscess  Result Value Ref Range Status   Fungus Stain Final report  Final    Comment: (NOTE) Performed At: [facility] [address] [place], [place] [id number] Jolene Schimke MD WU:[id number]    Fungus (Mycology) Culture PENDING  Incomplete   Fungal Source LEFT NECK SPEC A  Final    Comment: Performed at [facility], [address], [place], [place] [address]  Aerobic/Anaerobic Culture w Gram Stain (surgical/deep wound)     Status: None   Collection Time: 09/08/21  7:28 PM   Specimen: Neck, Left; Abscess  Result Value Ref Range Status    Specimen Description ABSCESS  Final   Special Requests LEFT NECK SPEC A  Final   Gram Stain   Final    FEW SQUAMOUS EPITHELIAL CELLS PRESENT FEW WBC PRESENT, PREDOMINANTLY MONONUCLEAR MODERATE GRAM POSITIVE COCCI FEW GRAM NEGATIVE RODS FEW GRAM POSITIVE RODS    Culture   Final    RARE STREPTOCOCCUS CONSTELLATUS RARE STREPTOCOCCUS MITIS/ORALIS RARE CARDIOBACTERIUM HOMINIS Standardized susceptibility testing for this organism is not available. RARE PREVOTELLA DENTICOLA BETA LACTAMASE POSITIVE Performed at [facility], [address], [place], [place] [address]    Report Status 09/13/2021 FINAL  Final   Organism ID, Bacteria STREPTOCOCCUS MITIS/ORALIS  Final   Organism ID, Bacteria STREPTOCOCCUS CONSTELLATUS  Final      Susceptibility   Streptococcus constellatus - MIC*    PENICILLIN <=0.06 SENSITIVE Sensitive     CEFTRIAXONE 0.5 SENSITIVE Sensitive     ERYTHROMYCIN 4 RESISTANT Resistant     LEVOFLOXACIN 0.5 SENSITIVE Sensitive     VANCOMYCIN 0.5 SENSITIVE Sensitive     * RARE STREPTOCOCCUS CONSTELLATUS   Streptococcus mitis/oralis - MIC*    TETRACYCLINE 4 SENSITIVE Sensitive     VANCOMYCIN 0.5 SENSITIVE Sensitive     CLINDAMYCIN >=1 RESISTANT Resistant     PENICILLIN Value in next row Intermediate      INTERMEDIATE1    CEFTRIAXONE Value in next row Intermediate      INTERMEDIATE2    * RARE STREPTOCOCCUS MITIS/ORALIS  Fungus Culture Result     Status: None   Collection Time: 09/08/21  7:28 PM  Result Value Ref Range Status   Result 1 Comment  Final    Comment: (NOTE) KOH/Calcofluor preparation:  no fungus observed. Performed At: [facility] [address] [place], [place] [id number] Jolene Schimke MD MW:[id number]   MRSA Next Gen by PCR, Nasal     Status: None   Collection Time: 09/08/21  9:09 PM   Specimen: Nasal Mucosa; Nasal Swab  Result Value Ref Range Status   MRSA by PCR Next Gen NOT DETECTED NOT DETECTED Final    Comment: (NOTE) The GeneXpert  MRSA Assay (FDA approved for NASAL specimens only), is one component of a comprehensive MRSA colonization surveillance program. It is not intended to diagnose MRSA infection nor to guide or monitor treatment for MRSA infections. Test performance is not FDA approved in patients less than 91 years old. Performed at [facility], [address], [place], [place] [address]      Time coordinating discharge: Over 30 minutes  SIGNED:   Cipriano Bunker, MD  [facility] 09/15/2021, 4:33 PM Pager   If 7PM-7AM, please contact night-coverage

## 2021-09-15 NOTE — Care Management Obs Status (Signed)
MEDICARE OBSERVATION STATUS NOTIFICATION   Patient Details  Name: Shannon Stephenson MRN: [id number] Date of Birth: August 02, 1941   Medicare Observation Status Notification Given:  Yes    Lawerance Sabal, RN 09/15/2021, 9:57 AM

## 2021-09-15 NOTE — Plan of Care (Signed)
  Problem: Education: Goal: Knowledge of General Education information will improve Description: Including pain rating scale, medication(s)/side effects and non-pharmacologic comfort measures Outcome: Completed/Met

## 2021-10-08 LAB — FUNGUS CULTURE WITH STAIN

## 2021-10-08 LAB — FUNGUS CULTURE RESULT

## 2021-10-08 LAB — FUNGAL ORGANISM REFLEX

## 2022-06-08 ENCOUNTER — Emergency Department (HOSPITAL_COMMUNITY): Payer: Medicare Other

## 2022-06-08 ENCOUNTER — Emergency Department (HOSPITAL_COMMUNITY)
Admission: EM | Admit: 2022-06-08 | Discharge: 2022-06-09 | Disposition: A | Discharge status: Home / Self Care | Payer: Medicare Other | Attending: Emergency Medicine | Admitting: Emergency Medicine

## 2022-06-08 DIAGNOSIS — Z20822 Contact with and (suspected) exposure to covid-19: Secondary | ICD-10-CM | POA: Insufficient documentation

## 2022-06-08 DIAGNOSIS — R531 Weakness: Secondary | ICD-10-CM | POA: Insufficient documentation

## 2022-06-08 DIAGNOSIS — Z79899 Other long term (current) drug therapy: Secondary | ICD-10-CM | POA: Diagnosis not present

## 2022-06-08 DIAGNOSIS — R251 Tremor, unspecified: Secondary | ICD-10-CM | POA: Diagnosis present

## 2022-06-08 LAB — URINALYSIS, ROUTINE W REFLEX MICROSCOPIC
Bilirubin Urine: NEGATIVE
Glucose, UA: NEGATIVE mg/dL
Hgb urine dipstick: NEGATIVE
Ketones, ur: NEGATIVE mg/dL
Leukocytes,Ua: NEGATIVE
Nitrite: NEGATIVE
Protein, ur: NEGATIVE mg/dL
Specific Gravity, Urine: 1.009 (ref 1.005–1.030)
pH: 7 (ref 5.0–8.0)

## 2022-06-08 LAB — CBC
HCT: 42.1 % (ref 36.0–46.0)
Hemoglobin: 13.6 g/dL (ref 12.0–15.0)
MCH: 30.2 pg (ref 26.0–34.0)
MCHC: 32.3 g/dL (ref 30.0–36.0)
MCV: 93.6 fL (ref 80.0–100.0)
Platelets: 217 10*3/uL (ref 150–400)
RBC: 4.5 MIL/uL (ref 3.87–5.11)
RDW: 12 % (ref 11.5–15.5)
WBC: 10.3 10*3/uL (ref 4.0–10.5)
nRBC: 0 % (ref 0.0–0.2)

## 2022-06-08 LAB — BASIC METABOLIC PANEL
Anion gap: 11 (ref 5–15)
BUN: 13 mg/dL (ref 8–23)
CO2: 23 mmol/L (ref 22–32)
Calcium: 9.4 mg/dL (ref 8.9–10.3)
Chloride: 103 mmol/L (ref 98–111)
Creatinine, Ser: 0.79 mg/dL (ref 0.44–1.00)
GFR, Estimated: >60 mL/min (ref >60)
Glucose, Bld: 129 mg/dL — ABNORMAL HIGH (ref 70–99)
Potassium: 3.7 mmol/L (ref 3.5–5.1)
Sodium: 137 mmol/L (ref 135–145)

## 2022-06-08 LAB — CBG MONITORING, ED: Glucose-Capillary: 124 mg/dL — ABNORMAL HIGH (ref 70–99)

## 2022-06-08 LAB — SARS CORONAVIRUS 2 BY RT PCR: SARS Coronavirus 2 by RT PCR: NEGATIVE

## 2022-06-08 LAB — TROPONIN I (HIGH SENSITIVITY): Troponin I (High Sensitivity): 4 ng/L (ref <18)

## 2022-06-08 MED ORDER — SODIUM CHLORIDE 0.9 % IV BOLUS
1000.0000 mL | Freq: Once | INTRAVENOUS | Status: AC
  Administered 2022-06-08: 1000 mL via INTRAVENOUS

## 2022-06-08 NOTE — ED Provider Notes (Incomplete)
[facility] EMERGENCY DEPARTMENT Provider Note   CSN: [id number] Arrival date & time: 06/08/22  1439     History {Add pertinent medical, surgical, social history, OB history to HPI:1} Chief Complaint  Patient presents with  . Weakness    Shannon Stephenson is a 81 y.o. female.   Weakness      Home Medications Prior to Admission medications   Medication Sig Start Date End Date Taking? Authorizing Provider  acetaminophen (TYLENOL) 500 MG tablet Take 500 mg by mouth every 6 (six) hours as needed for pain.    [provider]  alendronate (FOSAMAX) 70 MG tablet Take 70 mg by mouth once a week. 08/22/21   [provider]  chlorhexidine (PERIDEX) 0.12 % solution Use as directed 15 mLs in the mouth or throat 4 (four) times daily. 09/13/21   Almon Hercules, MD  hydrochlorothiazide (HYDRODIURIL) 25 MG tablet Take 25 mg by mouth every morning.     [provider]  ibuprofen (ADVIL) 600 MG tablet Take 600 mg by mouth every 6 (six) hours as needed for pain. 09/06/21   [provider]  traMADol (ULTRAM) 50 MG tablet Take 1 tablet (50 mg total) by mouth every 6 (six) hours as needed for pain. 10/09/13   Jene Every, MD      Allergies    Patient has no known allergies.    Review of Systems   Review of Systems  Neurological:  Positive for weakness.    Physical Exam Updated Vital Signs BP (!) 145/88 (BP Location: Right Arm)   Pulse 87   Temp 98.6 F (37 C) (Oral)   Resp 16   Ht 5\' 1"  (1.549 m)   Wt 63.5 kg   SpO2 97%   BMI 26.45 kg/m  Physical Exam  ED Results / Procedures / Treatments   Labs (all labs ordered are listed, but only abnormal results are displayed) Labs Reviewed  BASIC METABOLIC PANEL - Abnormal; Notable for the following components:      Result Value   Glucose, Bld 129 (*)    All other components within normal limits  URINALYSIS, ROUTINE W REFLEX MICROSCOPIC - Abnormal; Notable for the following components:    APPearance HAZY (*)    All other components within normal limits  CBG MONITORING, ED - Abnormal; Notable for the following components:   Glucose-Capillary 124 (*)    All other components within normal limits  SARS CORONAVIRUS 2 BY RT PCR  CBC  TSH  TROPONIN I (HIGH SENSITIVITY)    EKG None  Radiology DG Chest 2 View  Result Date: 06/08/2022 CLINICAL DATA:  Weakness EXAM: CHEST - 2 VIEW COMPARISON:  09/13/2021 FINDINGS: Lungs are clear. No pneumothorax or pleural effusion. Cardiac size is mildly enlarged, unchanged. Pulmonary vascularity is normal. No acute bone abnormality. IMPRESSION: No radiographic evidence of acute cardiopulmonary disease. Stable cardiomegaly Electronically Signed   By: 11/13/2021 M.D.   On: 06/08/2022 22:47    Procedures Procedures  {Document cardiac monitor, telemetry assessment procedure when appropriate:1}  Medications Ordered in ED Medications  sodium chloride 0.9 % bolus 1,000 mL (1,000 mLs Intravenous New Bag/Given 06/08/22 2300)    ED Course/ Medical Decision Making/ A&P Clinical Course as of 06/08/22 2320  Wed Jun 08, 2022  2308 Patient here with Tremors and weakness. The differential diagnosis of weakness includes but is not limited to neurologic causes (GBS, myasthenia gravis, CVA, MS, ALS, transverse myelitis, spinal cord injury, CVA, botulism, ) and  other causes: ACS, Arrhythmia, syncope, orthostatic hypotension, sepsis, hypoglycemia, electrolyte disturbance, hypothyroidism, respiratory failure, symptomatic anemia, dehydration, heat injury, polypharmacy, malignancy.   [AH]  2313 Basic metabolic panel(!) [AH]  2313 Glucose(!): 129 [AH]  2313 CBG monitoring, ED(!) [AH]  2313 Urinalysis, Routine w reflex microscopic Urine, Clean Catch(!) [AH]  2313 Color, Urine: YELLOW [AH]  2313 Appearance(!): HAZY [AH]    Clinical Course User Index [AH] Arthor Captain, PA-C                           Medical Decision Making Amount and/or Complexity  of Data Reviewed Labs: ordered. Decision-making details documented in ED Course. Radiology: ordered.   ***  {Document critical care time when appropriate:1} {Document review of labs and clinical decision tools ie heart score, Chads2Vasc2 etc:1}  {Document your independent review of radiology images, and any outside records:1} {Document your discussion with family members, caretakers, and with consultants:1} {Document social determinants of health affecting pt's care:1} {Document your decision making why or why not admission, treatments were needed:1} Final Clinical Impression(s) / ED Diagnoses Final diagnoses:  None    Rx / DC Orders ED Discharge Orders     None

## 2022-06-08 NOTE — ED Provider Notes (Signed)
[facility] EMERGENCY DEPARTMENT Provider Note   CSN: [id number] Arrival date & time: 06/08/22  1439     History  Chief Complaint  Patient presents with   Weakness    Shannon Stephenson is a 81 y.o. female who presents emergency department with weakness and tremors.  The patient is [place] speaking.  Her son provides the history.  He states that yesterday morning she was in her normal state of health.  They ate breakfast together and she seemed to be doing well.  Yesterday afternoon the patient called him stating that she did not feel well and that she could not stop shaking.  He states that he noticed that she was having tremors all over that are worsened when she sits up or tries to stand.  He also noticed that she is having some difficulty walking and is shuffling her feet more.  This is new.  She has no change in any medications.  She has not been recently ill.  She has no history of similar symptoms.  She has not had any cough, fever, shortness of breath, abdominal pain, urinary symptoms.  He states that they went to an urgent care earlier and told the provider that she fell a month ago who sent her here for CT imaging.  She has no other complaints at this time.   Weakness      Home Medications Prior to Admission medications   Medication Sig Start Date End Date Taking? Authorizing Provider  acetaminophen (TYLENOL) 500 MG tablet Take 500 mg by mouth every 6 (six) hours as needed for pain.    [provider]  alendronate (FOSAMAX) 70 MG tablet Take 70 mg by mouth once a week. 08/22/21   [provider]  chlorhexidine (PERIDEX) 0.12 % solution Use as directed 15 mLs in the mouth or throat 4 (four) times daily. 09/13/21   Almon Hercules, MD  hydrochlorothiazide (HYDRODIURIL) 25 MG tablet Take 25 mg by mouth every morning.     [provider]  ibuprofen (ADVIL) 600 MG tablet Take 600 mg by mouth every 6 (six) hours as needed for pain. 09/06/21    [provider]  traMADol (ULTRAM) 50 MG tablet Take 1 tablet (50 mg total) by mouth every 6 (six) hours as needed for pain. 10/09/13   Jene Every, MD      Allergies    Patient has no known allergies.    Review of Systems   Review of Systems  Neurological:  Positive for weakness.    Physical Exam Updated Vital Signs BP (!) 145/88 (BP Location: Right Arm)   Pulse 87   Temp 98.6 F (37 C) (Oral)   Resp 16   Ht 5\' 1"  (1.549 m)   Wt 63.5 kg   SpO2 97%   BMI 26.45 kg/m  Physical Exam Vitals and nursing note reviewed.  Constitutional:      General: She is not in acute distress.    Appearance: She is well-developed. She is not diaphoretic.  HENT:     Head: Normocephalic and atraumatic.     Right Ear: External ear normal.     Left Ear: External ear normal.     Nose: Nose normal.     Mouth/Throat:     Mouth: Mucous membranes are moist.  Eyes:     General: No scleral icterus.    Conjunctiva/sclera: Conjunctivae normal.  Cardiovascular:     Rate and Rhythm: Normal rate and regular rhythm.  Heart sounds: Normal heart sounds. No murmur heard.    No friction rub. No gallop.  Pulmonary:     Effort: Pulmonary effort is normal. No respiratory distress.     Breath sounds: Normal breath sounds.  Abdominal:     General: Bowel sounds are normal. There is no distension.     Palpations: Abdomen is soft. There is no mass.     Tenderness: There is no abdominal tenderness. There is no guarding.  Musculoskeletal:     Cervical back: Normal range of motion.  Skin:    General: Skin is warm and dry.  Neurological:     Mental Status: She is alert and oriented to person, place, and time.     GCS: GCS eye subscore is 4. GCS verbal subscore is 5. GCS motor subscore is 6.     Cranial Nerves: No cranial nerve deficit or facial asymmetry.     Sensory: Sensation is intact.     Motor: Tremor present. No weakness, atrophy, abnormal muscle tone, seizure activity or pronator drift.      Deep Tendon Reflexes:     Reflex Scores:      Bicep reflexes are 1+ on the right side.      Brachioradialis reflexes are 1+ on the right side.      Patellar reflexes are 1+ on the right side.      Achilles reflexes are 1+ on the right side.    Comments: Corse upper extremity tremors, worse with effort Muted reflexes. 5/5 BL U/L extremity strength. Muted patellar reflexes.  Psychiatric:        Behavior: Behavior normal.     ED Results / Procedures / Treatments   Labs (all labs ordered are listed, but only abnormal results are displayed) Labs Reviewed  BASIC METABOLIC PANEL - Abnormal; Notable for the following components:      Result Value   Glucose, Bld 129 (*)    All other components within normal limits  URINALYSIS, ROUTINE W REFLEX MICROSCOPIC - Abnormal; Notable for the following components:   APPearance HAZY (*)    All other components within normal limits  CBG MONITORING, ED - Abnormal; Notable for the following components:   Glucose-Capillary 124 (*)    All other components within normal limits  SARS CORONAVIRUS 2 BY RT PCR  CBC  TSH  TROPONIN I (HIGH SENSITIVITY)    EKG None  Radiology DG Chest 2 View  Result Date: 06/08/2022 CLINICAL DATA:  Weakness EXAM: CHEST - 2 VIEW COMPARISON:  09/13/2021 FINDINGS: Lungs are clear. No pneumothorax or pleural effusion. Cardiac size is mildly enlarged, unchanged. Pulmonary vascularity is normal. No acute bone abnormality. IMPRESSION: No radiographic evidence of acute cardiopulmonary disease. Stable cardiomegaly Electronically Signed   By: Helyn Numbers M.D.   On: 06/08/2022 22:47    Procedures Procedures    Medications Ordered in ED Medications  sodium chloride 0.9 % bolus 1,000 mL (1,000 mLs Intravenous New Bag/Given 06/08/22 2300)    ED Course/ Medical Decision Making/ A&P Clinical Course as of 06/09/22 1938  Wed Jun 08, 2022  2308 Patient here with Tremors and weakness. The differential diagnosis of weakness  includes but is not limited to neurologic causes (GBS, myasthenia gravis, CVA, MS, ALS, transverse myelitis, spinal cord injury, CVA, botulism, ) and other causes: ACS, Arrhythmia, syncope, orthostatic hypotension, sepsis, hypoglycemia, electrolyte disturbance, hypothyroidism, respiratory failure, symptomatic anemia, dehydration, heat injury, polypharmacy, malignancy.   [AH]  2313 Basic metabolic panel(!) [AH]  2313 Glucose(!): 129 [AH]  2313 CBG monitoring, ED(!) [AH]  2313 Urinalysis, Routine w reflex microscopic Urine, Clean Catch(!) [AH]  2313 Color, Urine: YELLOW [AH]  2313 Appearance(!): HAZY [AH]  Thu Jun 09, 2022  0237 Case discussed with Dr. Wilford Corner who recommends MRI to r/o basal ganglia stroke  [AH]  0410 MR BRAIN WO CONTRAST I visualiazed and interpreted images - no acute findings [AH]    Clinical Course User Index [AH] Arthor Captain, PA-C                           Medical Decision Making 81 year old female here with tremors and weakness . After review of all data points patient does not appear to have ACS, thyroid dysfunction, liver disease, COVID, urinary tract infection, intracranial bleeding or obvious signs of stroke.  Patient is ambulatory but continues to have tremors. She will need ambulatory referral to neurology for further evaluation of her tremor. No other acute findings.  Amount and/or Complexity of Data Reviewed Labs: ordered. Decision-making details documented in ED Course. Radiology: ordered and independent interpretation performed. Decision-making details documented in ED Course.    Details: Visualized and interpreted CT head and MRI of the brain, no acute findings ECG/medicine tests: independent interpretation performed.           Final Clinical Impression(s) / ED Diagnoses Final diagnoses:  None    Rx / DC Orders ED Discharge Orders     None         Arthor Captain, PA-C 06/10/22 0451    Gilda Crease, MD 06/11/22  [phone]

## 2022-06-08 NOTE — ED Triage Notes (Signed)
Pt here from UC with generalized weakness and tremors onset yesterday. Pt states she feels tired.

## 2022-06-08 NOTE — ED Provider Triage Note (Signed)
Emergency Medicine Provider Triage Evaluation Note  Shannon Stephenson , a 81 y.o. female  was evaluated in triage.  Pt complains of weakness. Since yesterday pt report feeling weak and tremulous.  No fever, headache, cp, sob, abd pain, n/v/d.  Report CBG is well controlled.  Denies confusion however son felt pt is a bit more confused than usual.  No cold sxs.  Report having similar sxs in the past with improvement with IVF.  Review of Systems  Positive: As above Negative: As above  Physical Exam  BP (!) 171/91   Pulse 88   Temp 98.7 F (37.1 C) (Oral)   Resp 18   SpO2 98%  Gen:   Awake, no distress   Resp:  Normal effort  MSK:   Moves extremities without difficulty  Other:    Medical Decision Making  Medically screening exam initiated at 3:37 PM.  Appropriate orders placed.  Deoni T Zuba was informed that the remainder of the evaluation will be completed by another provider, this initial triage assessment does not replace that evaluation, and the importance of remaining in the ED until their evaluation is complete.     Fayrene Helper, PA-C 06/08/22 1544

## 2022-06-09 ENCOUNTER — Emergency Department (HOSPITAL_COMMUNITY): Payer: Medicare Other

## 2022-06-09 LAB — TSH: TSH: 3.037 u[IU]/mL (ref 0.350–4.500)

## 2022-06-09 LAB — TROPONIN I (HIGH SENSITIVITY): Troponin I (High Sensitivity): 5 ng/L (ref <18)

## 2022-06-09 LAB — HEPATITIS PANEL, ACUTE
HCV Ab: NONREACTIVE
Hep A IgM: NONREACTIVE
Hep B C IgM: NONREACTIVE
Hepatitis B Surface Ag: NONREACTIVE

## 2022-06-09 NOTE — Discharge Instructions (Addendum)
Contact a health care provider if: You develop a tremor after starting a new medicine. You have a tremor along with other symptoms such as: Numbness. Tingling. Pain. Weakness. Your tremor gets worse. Your tremor interferes with your day-to-day life.

## 2023-07-01 IMAGING — CT CT NECK W/ CM
4 of 6 series · 13 of 33 positions shown, 15 images · IV contrast (APPLIED)
Comparison: None.

CLINICAL DATA: Throat swelling and dental disease.

EXAM:
CT NECK WITH CONTRAST
TECHNIQUE: Multidetector CT imaging of the neck was performed using the
standard protocol following the bolus administration of intravenous
contrast.
CONTRAST:  75mL OMNIPAQUE IOHEXOL 350 MG/ML SOLN

[Series 3: neck 2.0 i31s 3 · axial · 0.45mm/px · z∈[+288,+358]mm · 2 of 106 slices shown]
[im 36/106  bone]
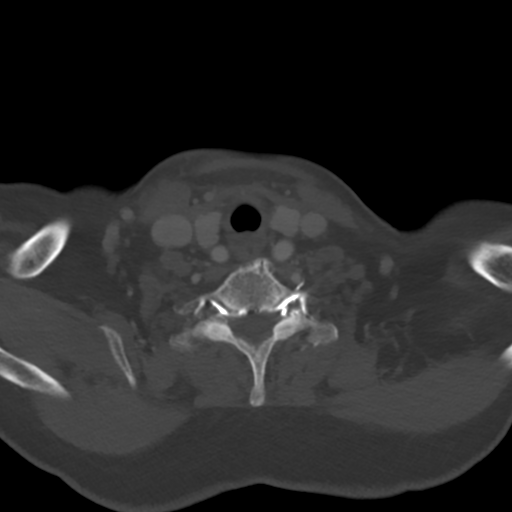
[im 71/106  bone]
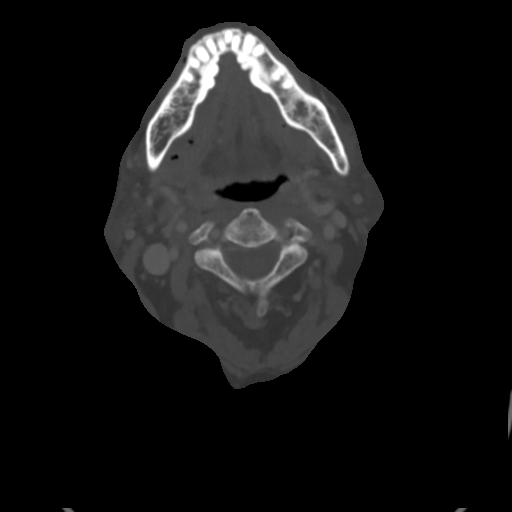

[Series 8: coronal st · coronal · 0.46mm/px · 3 of 85 slices shown]
[im 19/85  bone]
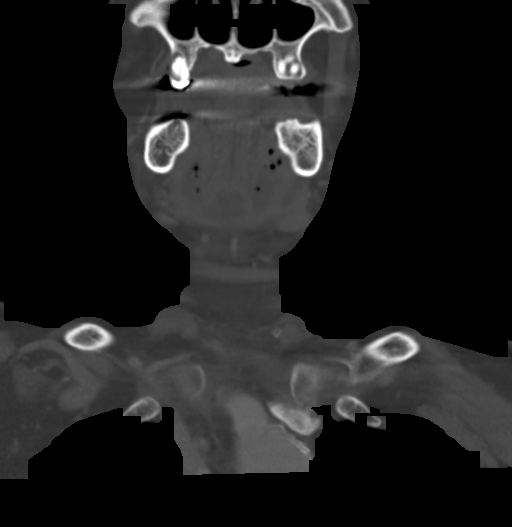
[im 35/85  bone]
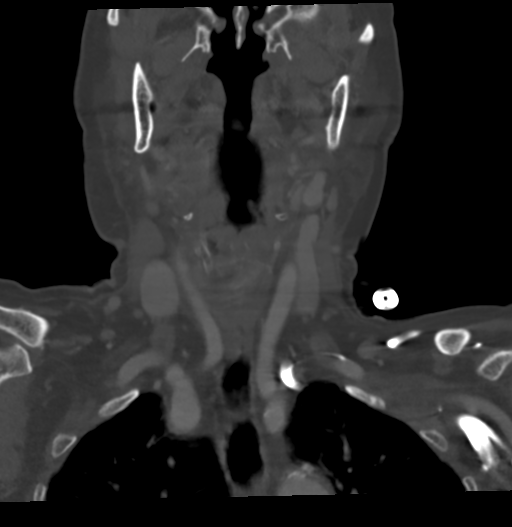
[im 50/85  bone]
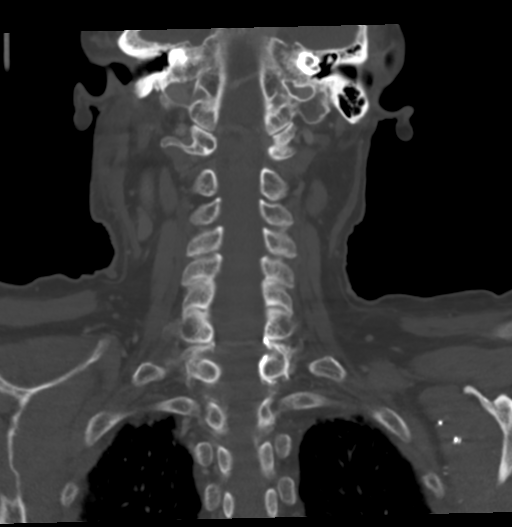

[Series 9: sagittal st · sagittal · 0.33mm/px · 5 of 76 slices shown, 6 images]
[im 26/76  bone]
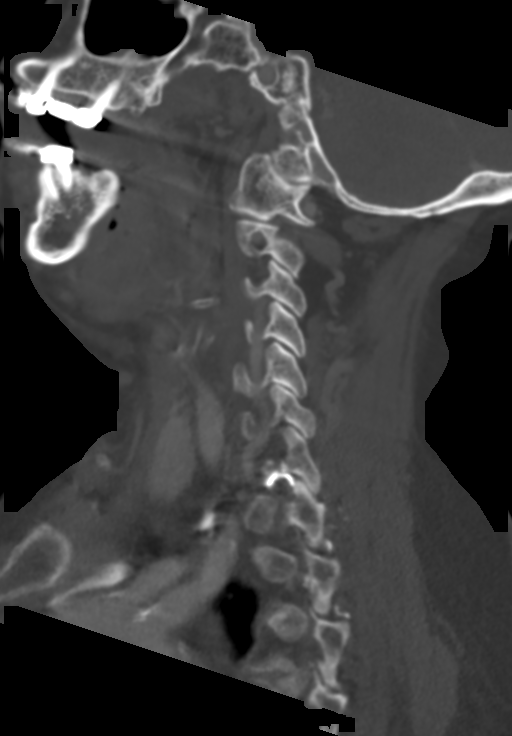
[im 32/76  bone]
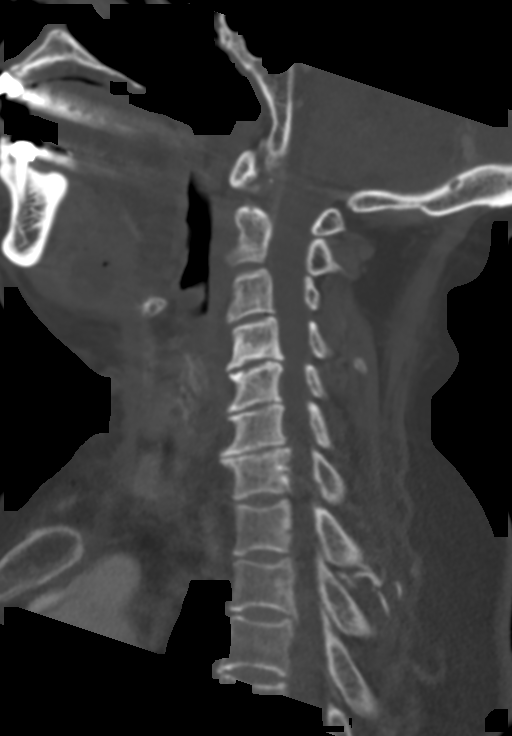
[im 38/76  soft-tissue]
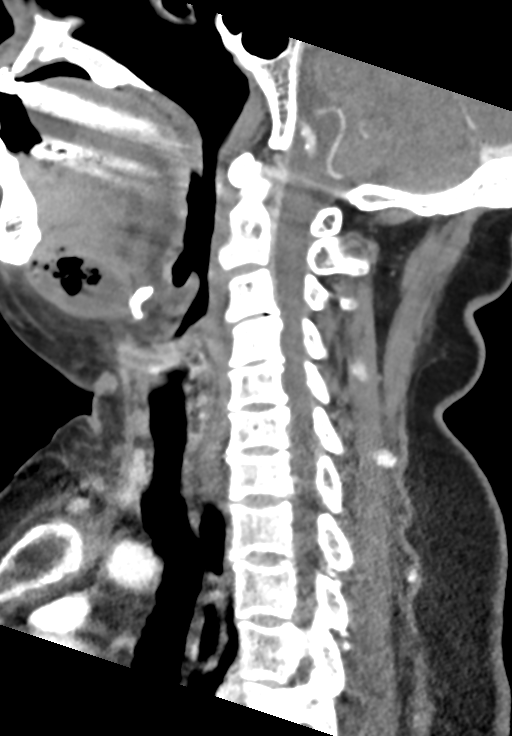
[im 38/76  bone]
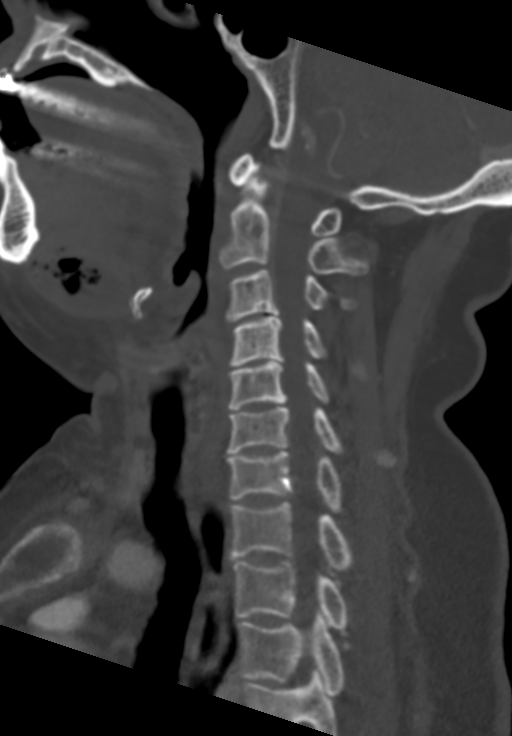
[im 44/76  bone]
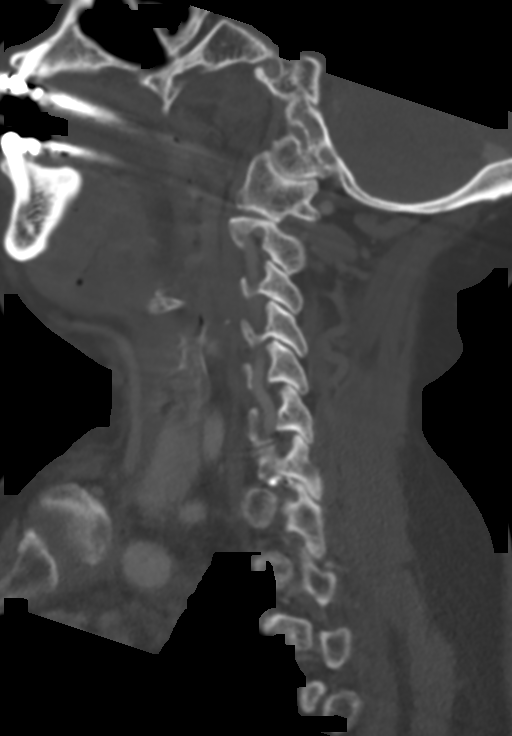
[im 51/76  bone]
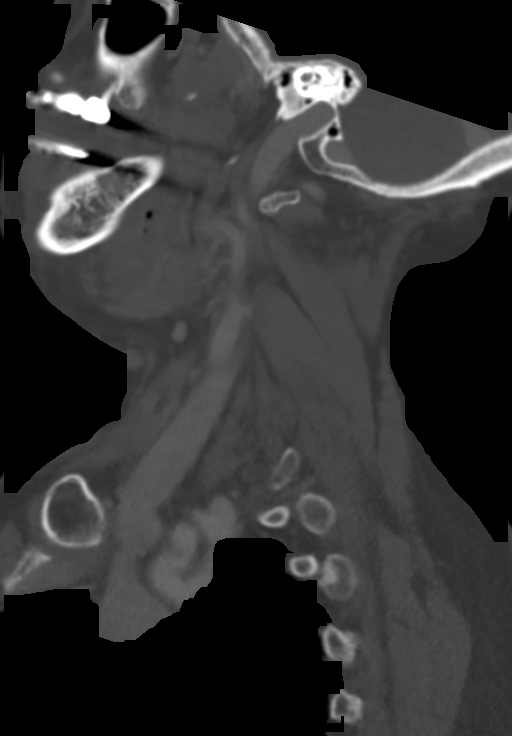

[Series 10: orthogonal st · axial · 0.33mm/px · z∈[+239,+353]mm · 3 of 122 slices shown, 4 images]
[im 31/122  soft-tissue]
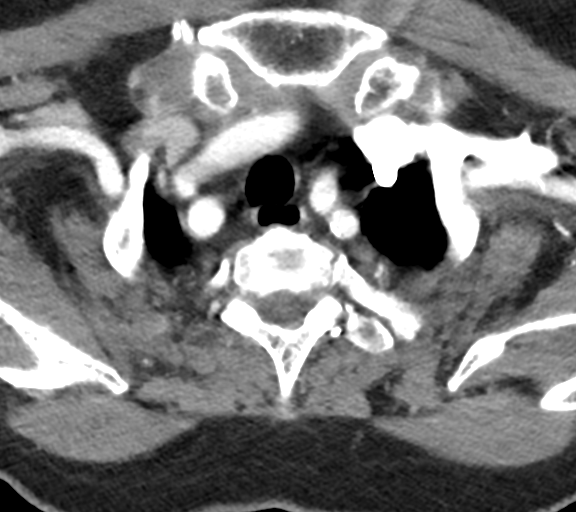
[im 31/122  bone]
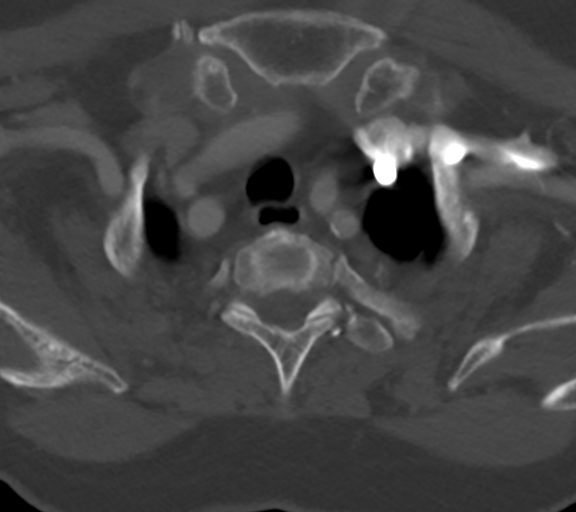
[im 61/122  bone]
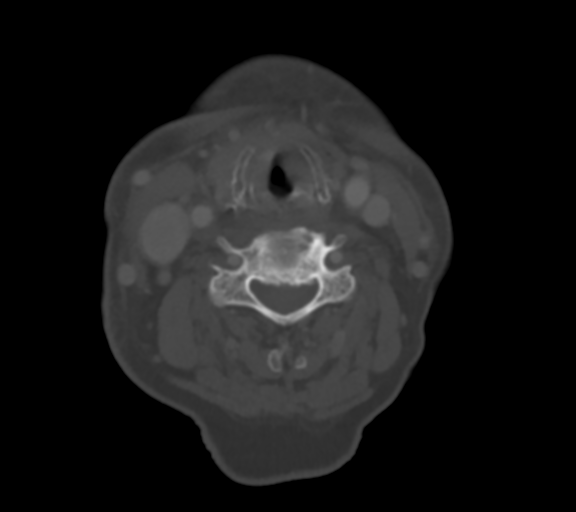
[im 91/122  bone]
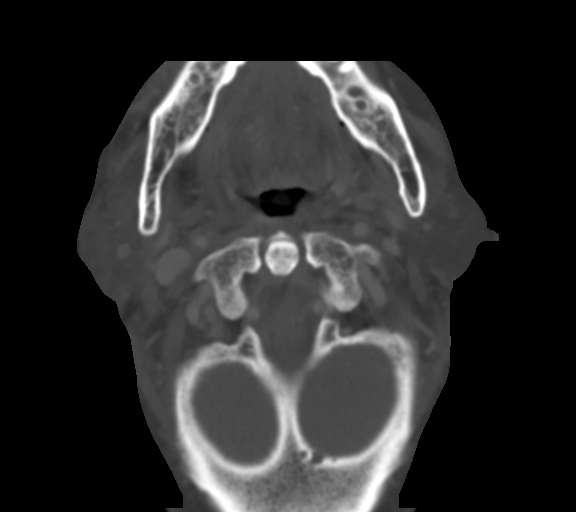

[13 of 33 positions shown; findings below may reference images not displayed]

FINDINGS: Pharynx and larynx: There is a large abscess of sublingual space
that measures 4.4 x 1.3 cm. There is multifocal internal gas. There
is a component of the abscess extending into the left submandibular
space. The retropharyngeal space is clear. The nasopharynx is
normal. There is no airway stenosis. Normal epiglottis.

Salivary glands: Negative

Thyroid: Normal

Lymph nodes: None enlarged or abnormal density.

Vascular: Negative.

Limited intracranial: Negative.

Visualized orbits: Negative.

Mastoids and visualized paranasal sinuses: Clear.

Skeleton: No acute or aggressive process.

Upper chest: Negative.

Other: None.
IMPRESSION: 1. Large abscess of the sublingual space with extension into the
left submandibular space.
2. No airway stenosis.

## 2023-07-02 IMAGING — DX DG ORTHOPANTOGRAM /PANORAMIC
1 series · 1 of 1 positions shown · non-contrast
Comparison: None.

CLINICAL DATA: Swelling

EXAM:
ORTHOPANTOGRAM/PANORAMIC 1 view

[view not recorded]
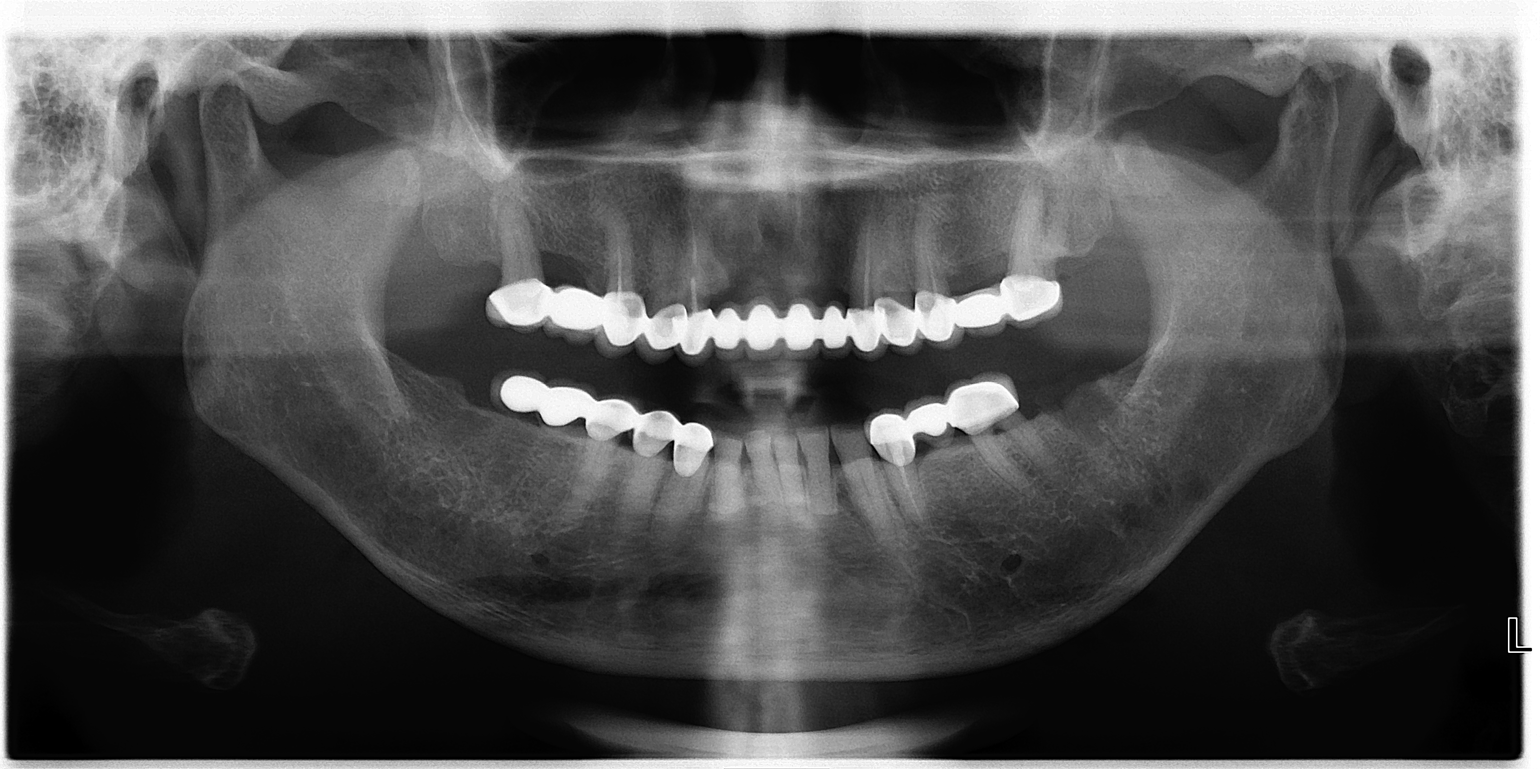

[1 of 1 positions shown; findings below may reference images not displayed]

FINDINGS: Dental amalgam. No periapical lucencies identified. The mandible
demonstrates normal alignment without acute osseous abnormality.
IMPRESSION: No acute abnormality. No radiographic findings of a periodontal
abscess.

## 2023-07-02 IMAGING — DX DG CHEST 1V PORT
1 series · 1 of 1 positions shown · non-contrast
Comparison: 04/06/2021

CLINICAL DATA: Intubated

EXAM:
PORTABLE CHEST 1 VIEW

[chest ap]
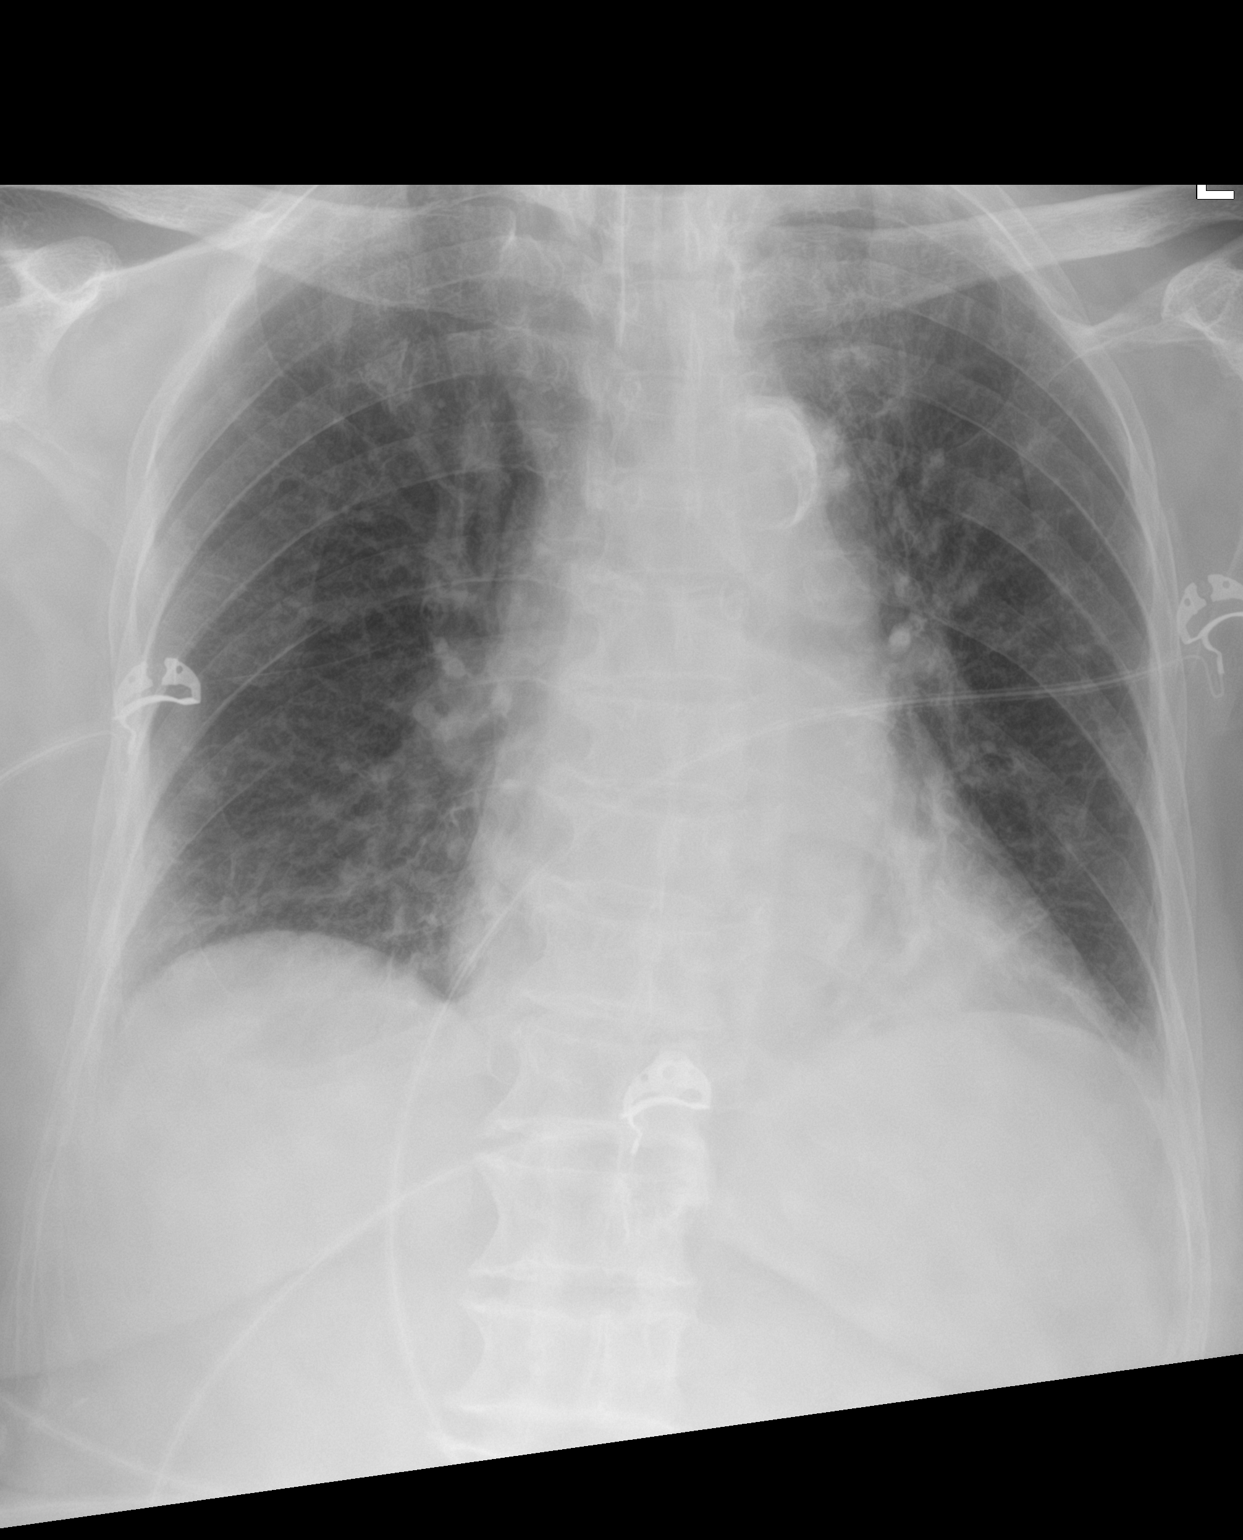

[1 of 1 positions shown; findings below may reference images not displayed]

FINDINGS: Single frontal view of the chest demonstrates an unremarkable
cardiac silhouette. Endotracheal tube overlies tracheal air column
tip at level of thoracic inlet. Linear consolidation at the left
lung base likely reflects atelectasis. No airspace disease,
effusion, or pneumothorax.
IMPRESSION: 1. No complications after intubation.
2. Likely hypoventilatory changes left lung base.
